# Patient Record
Sex: Female | Born: 1990 | Hispanic: Yes | Marital: Single | Smoking: Never smoker
Health system: Southern US, Community
[De-identification: ages and names within clinical notes are randomized; demographics above are authoritative.]

## PROBLEM LIST (undated history)

## (undated) ENCOUNTER — Inpatient Hospital Stay (HOSPITAL_COMMUNITY): Payer: Self-pay

## (undated) DIAGNOSIS — Z789 Other specified health status: Secondary | ICD-10-CM

## (undated) HISTORY — DX: Other specified health status: Z78.9

---

## 2012-05-04 ENCOUNTER — Emergency Department (HOSPITAL_COMMUNITY)
Admission: EM | Admit: 2012-05-04 | Discharge: 2012-05-04 | Disposition: A | Discharge status: Home / Self Care | Payer: Self-pay | Attending: Emergency Medicine | Admitting: Emergency Medicine

## 2012-05-04 ENCOUNTER — Encounter (HOSPITAL_COMMUNITY): Payer: Self-pay | Admitting: Emergency Medicine

## 2012-05-04 ENCOUNTER — Emergency Department (HOSPITAL_COMMUNITY): Payer: Self-pay

## 2012-05-04 DIAGNOSIS — O2390 Unspecified genitourinary tract infection in pregnancy, unspecified trimester: Secondary | ICD-10-CM | POA: Insufficient documentation

## 2012-05-04 DIAGNOSIS — N390 Urinary tract infection, site not specified: Secondary | ICD-10-CM | POA: Insufficient documentation

## 2012-05-04 DIAGNOSIS — J029 Acute pharyngitis, unspecified: Secondary | ICD-10-CM | POA: Insufficient documentation

## 2012-05-04 DIAGNOSIS — Z3490 Encounter for supervision of normal pregnancy, unspecified, unspecified trimester: Secondary | ICD-10-CM

## 2012-05-04 LAB — CBC WITH DIFFERENTIAL/PLATELET
Eosinophils Absolute: 0.1 10*3/uL (ref 0.0–0.7)
Hemoglobin: 14 g/dL (ref 12.0–15.0)
Lymphs Abs: 1.5 10*3/uL (ref 0.7–4.0)
MCH: 29.3 pg (ref 26.0–34.0)
Monocytes Relative: 8 % (ref 3–12)
Neutro Abs: 5.3 10*3/uL (ref 1.7–7.7)
Neutrophils Relative %: 71 % (ref 43–77)
Platelets: 264 10*3/uL (ref 150–400)
RBC: 4.78 MIL/uL (ref 3.87–5.11)
WBC: 7.4 10*3/uL (ref 4.0–10.5)

## 2012-05-04 LAB — COMPREHENSIVE METABOLIC PANEL
ALT: 33 U/L (ref 0–35)
Albumin: 4.2 g/dL (ref 3.5–5.2)
Alkaline Phosphatase: 74 U/L (ref 39–117)
Chloride: 101 mEq/L (ref 96–112)
GFR calc Af Amer: >90 mL/min (ref >90)
Glucose, Bld: 94 mg/dL (ref 70–99)
Potassium: 3.4 mEq/L — ABNORMAL LOW (ref 3.5–5.1)
Sodium: 134 mEq/L — ABNORMAL LOW (ref 135–145)
Total Bilirubin: 0.4 mg/dL (ref 0.3–1.2)
Total Protein: 7.5 g/dL (ref 6.0–8.3)

## 2012-05-04 LAB — URINALYSIS, MICROSCOPIC ONLY
Bilirubin Urine: NEGATIVE
Hgb urine dipstick: NEGATIVE
Ketones, ur: NEGATIVE mg/dL
Specific Gravity, Urine: 1.015 (ref 1.005–1.030)
pH: 6.5 (ref 5.0–8.0)

## 2012-05-04 LAB — POCT PREGNANCY, URINE: Preg Test, Ur: POSITIVE — AB

## 2012-05-04 MED ORDER — CEPHALEXIN 500 MG PO CAPS: 500.0000 mg | ORAL_CAPSULE | Freq: Two times a day (BID) | ORAL | Status: DC

## 2012-05-04 MED ORDER — SODIUM CHLORIDE 0.9 % IV SOLN
1000.0000 mL | Freq: Once | INTRAVENOUS | Status: AC
  Administered 2012-05-04: 1000 mL via INTRAVENOUS

## 2012-05-04 NOTE — ED Notes (Signed)
Pt presenting to ed with c/o abdominal pain with nausea and vomiting x 2 days ago. Pt states she also has fever and sore throat. Pt states normal bowel movement yesterday. Pt denies any dysuria at this time

## 2012-05-04 NOTE — ED Notes (Signed)
Pt c/o abdominal pain x2days.  She states that it hurts when she eats and that it feels like her stomach is very big and full of air.  Pt states that her last BM was today and that she has passing flatus.  States she did have N/V yesterday.

## 2012-05-04 NOTE — ED Provider Notes (Signed)
Medical screening examination/treatment/procedure(s) were performed by non-physician practitioner and as supervising physician I was immediately available for consultation/collaboration.  Cheri Guppy, MD 05/04/12 [phone]

## 2012-05-04 NOTE — ED Notes (Signed)
RN to obtain labs with start of IV 

## 2012-05-04 NOTE — ED Provider Notes (Signed)
History     CSN: [id number]  Arrival date & time 05/04/12  1311   First MD Initiated Contact with Patient 05/04/12 1353      Chief Complaint  Patient presents with  . Abdominal Pain  . Sore Throat    (Consider location/radiation/quality/duration/timing/severity/associated sxs/prior treatment) Patient is a 21 y.o. female presenting with abdominal pain. The history is provided by the patient.  Abdominal Pain The primary symptoms of the illness include abdominal pain, fatigue, nausea and vomiting. The primary symptoms of the illness do not include fever, shortness of breath, diarrhea, hematemesis, hematochezia, dysuria, vaginal discharge or vaginal bleeding. The current episode started 2 days ago. The onset of the illness was gradual. The problem has not changed since onset. The abdominal pain is generalized. The abdominal pain does not radiate. The severity of the abdominal pain is 5/10. The abdominal pain is relieved by nothing.  The nausea is associated with eating. The nausea is exacerbated by food, motion and activity.   Symptoms associated with the illness do not include chills, heartburn, constipation, urgency, hematuria, frequency or back pain. Significant associated medical issues do not include PUD, GERD, inflammatory bowel disease, diabetes, sickle cell disease, gallstones, liver disease, substance abuse, diverticulitis, HIV or cardiac disease.    History reviewed. No pertinent past medical history.  Past Surgical History  Procedure Date  . Cesarean section     No family history on file.  History  Substance Use Topics  . Smoking status: Never Smoker   . Smokeless tobacco: Not on file  . Alcohol Use: No    OB History    Grav Para Term Preterm Abortions TAB SAB Ect Mult Living                  Review of Systems  Constitutional: Positive for fatigue. Negative for fever, chills and appetite change.  HENT: Negative for congestion.   Eyes: Negative for visual  disturbance.  Respiratory: Negative for shortness of breath.   Cardiovascular: Negative for chest pain and leg swelling.  Gastrointestinal: Positive for nausea, vomiting and abdominal pain. Negative for heartburn, diarrhea, constipation, hematochezia and hematemesis.  Genitourinary: Negative for dysuria, urgency, frequency, hematuria, vaginal bleeding and vaginal discharge.  Musculoskeletal: Negative for back pain.  Neurological: Negative for dizziness, syncope, weakness, light-headedness, numbness and headaches.  Psychiatric/Behavioral: Negative for confusion.  All other systems reviewed and are negative.    Allergies  Review of patient's allergies indicates no known allergies.  Home Medications   Current Outpatient Rx  Name Route Sig Dispense Refill  . ACETAMINOPHEN 160 MG/5ML PO SOLN Oral Take 320 mg by mouth every 4 (four) hours as needed. For pain/fever.      BP 107/60  Pulse 95  Temp 98.2 F (36.8 C) (Oral)  Resp 20  SpO2 100%  LMP 04/07/2012  Physical Exam  Constitutional: She is oriented to person, place, and time. She appears well-developed and well-nourished. No distress.  HENT:  Head: Normocephalic and atraumatic.  Mouth/Throat: Oropharynx is clear and moist. No oropharyngeal exudate.  Eyes: Conjunctivae normal and EOM are normal. Pupils are equal, round, and reactive to light. No scleral icterus.  Neck: Normal range of motion. Neck supple. No tracheal deviation present. No thyromegaly present.  Cardiovascular: Normal rate, regular rhythm, normal heart sounds and intact distal pulses.   Pulmonary/Chest: Effort normal and breath sounds normal. No stridor. No respiratory distress. She has no wheezes.  Abdominal: Soft. There is generalized tenderness.    Musculoskeletal: Normal range of motion.  She exhibits no edema and no tenderness.  Neurological: She is alert and oriented to person, place, and time. Coordination normal.  Skin: Skin is warm and dry. No rash  noted. She is not diaphoretic. No erythema. No pallor.  Psychiatric: She has a normal mood and affect. Her behavior is normal.    ED Course  Procedures (including critical care time)  Labs Reviewed  COMPREHENSIVE METABOLIC PANEL - Abnormal; Notable for the following:    Sodium 134 (*)     Potassium 3.4 (*)     All other components within normal limits  URINALYSIS, MICROSCOPIC ONLY - Abnormal; Notable for the following:    APPearance CLOUDY (*)     Leukocytes, UA MODERATE (*)     Bacteria, UA MANY (*)     Squamous Epithelial / LPF MANY (*)     All other components within normal limits  POCT PREGNANCY, URINE - Abnormal; Notable for the following:    Preg Test, Ur POSITIVE (*)     All other components within normal limits  CBC WITH DIFFERENTIAL  LIPASE, BLOOD  RAPID STREP SCREEN   US Abdomen Complete  05/04/2012  *RADIOLOGY REPORT*  Clinical Data:  Abdominal pain.  Pregnant.  COMPLETE ABDOMINAL ULTRASOUND  Comparison:  None.  Findings:  Gallbladder:  No gallstones, gallbladder wall thickening, or pericholecystic fluid.  Common bile duct:  Normal in caliber, measuring 2.9 mm in diameter proximally.  Liver:  Diffusely echogenic.  IVC:  Appears normal.  Pancreas:  Obscured by overlying bowel gas.  The expected position of the pancreasis is in the region of pain reported by the patient.  Spleen:  Normal, measuring 8.1 cm in length.  Right Kidney:  Normal, measuring 11.5 cm in length.  Left Kidney:  Normal, measuring 10.8 cm in length.  Abdominal aorta:  Obscured by overlying bowel gas distally.  The visualized portions appear normal.  IMPRESSION:  1.  No gallstones seen. 2.  Obscured pancreas and distal abdominal aorta by overlying bowel gas.  The patient's pain is in the region of the expected location of the pancreas. 3.  Diffusely echogenic liver, most likely due to steatosis.   Original Report Authenticated By: Darrol Angel, M.D.      No diagnosis found.    MDM  Pregnant UTI- dc  keflex & culture pending   21 year old female to ER with chief complaint of abdominal pain, nausea and emesis  with onset of symptoms 2 days ago.  Labs and imaging reviewed and patient is found to have a possible urinary tract infection they'll be treated with Keflex and an incidental positive pregnancy test.  At this time there does not appear to be any evidence of an acute emergency medical condition and the patient appears stable for discharge with appropriate outpatient follow up ([facility]) .Diagnosis was discussed with patient who verbalizes understanding and is agreeable to discharge.        Jaci Carrel, [place] 05/04/12 1629

## 2012-05-06 LAB — URINE CULTURE

## 2012-05-16 ENCOUNTER — Ambulatory Visit (INDEPENDENT_AMBULATORY_CARE_PROVIDER_SITE_OTHER): Payer: Self-pay | Admitting: Advanced Practice Midwife

## 2012-05-16 ENCOUNTER — Encounter: Payer: Self-pay | Admitting: Family Medicine

## 2012-05-16 ENCOUNTER — Encounter: Payer: Self-pay | Admitting: Advanced Practice Midwife

## 2012-05-16 VITALS — BP 115/67 | Temp 97.2°F | Wt 136.8 lb

## 2012-05-16 DIAGNOSIS — Z23 Encounter for immunization: Secondary | ICD-10-CM

## 2012-05-16 DIAGNOSIS — N911 Secondary amenorrhea: Secondary | ICD-10-CM | POA: Insufficient documentation

## 2012-05-16 DIAGNOSIS — Z3490 Encounter for supervision of normal pregnancy, unspecified, unspecified trimester: Secondary | ICD-10-CM

## 2012-05-16 DIAGNOSIS — O34219 Maternal care for unspecified type scar from previous cesarean delivery: Secondary | ICD-10-CM

## 2012-05-16 DIAGNOSIS — N926 Irregular menstruation, unspecified: Secondary | ICD-10-CM | POA: Insufficient documentation

## 2012-05-16 DIAGNOSIS — N912 Amenorrhea, unspecified: Secondary | ICD-10-CM

## 2012-05-16 LAB — HIV ANTIBODY (ROUTINE TESTING W REFLEX): HIV: NONREACTIVE

## 2012-05-16 MED ORDER — INFLUENZA VIRUS VACC SPLIT PF IM SUSP: 0.5000 mL | INTRAMUSCULAR | Status: DC

## 2012-05-16 NOTE — Patient Instructions (Signed)
Pregnancy - First Trimester During sexual intercourse, millions of sperm go into the vagina. Only 1 sperm will penetrate and fertilize the female egg while it is in the Fallopian tube. One week later, the fertilized egg implants into the wall of the uterus. An embryo begins to develop into a baby. At 6 to 8 weeks, the eyes and face are formed and the heartbeat can be seen on ultrasound. At the end of 12 weeks (first trimester), all the baby's organs are formed. Now that you are pregnant, you will want to do everything you can to have a healthy baby. Two of the most important things are to get good prenatal care and follow your caregiver's instructions. Prenatal care is all the medical care you receive before the baby's birth. It is given to prevent, find, and treat problems during the pregnancy and childbirth. PRENATAL EXAMS  During prenatal visits, your weight, blood pressure and urine are checked. This is done to make sure you are healthy and progressing normally during the pregnancy.  A pregnant woman should gain 25 to 35 pounds during the pregnancy. However, if you are over weight or underweight, your caregiver will advise you regarding your weight.  Your caregiver will ask and answer questions for you.  Blood work, cervical cultures, other necessary tests and a Pap test are done during your prenatal exams. These tests are done to check on your health and the probable health of your baby. Tests are strongly recommended and done for HIV with your permission. This is the virus that causes AIDS. These tests are done because medications can be given to help prevent your baby from being born with this infection should you have been infected without knowing it. Blood work is also used to find out your blood type, previous infections and follow your blood levels (hemoglobin).  Low hemoglobin (anemia) is common during pregnancy. Iron and vitamins are given to help prevent this. Later in the pregnancy, blood  tests for diabetes will be done along with any other tests if any problems develop. You may need tests to make sure you and the baby are doing well.  You may need other tests to make sure you and the baby are doing well. CHANGES DURING THE FIRST TRIMESTER (THE FIRST 3 MONTHS OF PREGNANCY) Your body goes through many changes during pregnancy. They vary from person to person. Talk to your caregiver about changes you notice and are concerned about. Changes can include:  Your menstrual period stops.  The egg and sperm carry the genes that determine what you look like. Genes from you and your partner are forming a baby. The female genes determine whether the baby is a boy or a girl.  Your body increases in girth and you may feel bloated.  Feeling sick to your stomach (nauseous) and throwing up (vomiting). If the vomiting is uncontrollable, call your caregiver.  Your breasts will begin to enlarge and become tender.  Your nipples may stick out more and become darker.  The need to urinate more. Painful urination may mean you have a bladder infection.  Tiring easily.  Loss of appetite.  Cravings for certain kinds of food.  At first, you may gain or lose a couple of pounds.  You may have changes in your emotions from day to day (excited to be pregnant or concerned something may go wrong with the pregnancy and baby).  You may have more vivid and strange dreams. HOME CARE INSTRUCTIONS   It is very important   to avoid all smoking, alcohol and un-prescribed drugs during your pregnancy. These affect the formation and growth of the baby. Avoid chemicals while pregnant to ensure the delivery of a healthy infant.  Start your prenatal visits by the 12th week of pregnancy. They are usually scheduled monthly at first, then more often in the last 2 months before delivery. Keep your caregiver's appointments. Follow your caregiver's instructions regarding medication use, blood and lab tests, exercise, and  diet.  During pregnancy, you are providing food for you and your baby. Eat regular, well-balanced meals. Choose foods such as meat, fish, milk and other low fat dairy products, vegetables, fruits, and whole-grain breads and cereals. Your caregiver will tell you of the ideal weight gain.  You can help morning sickness by keeping soda crackers at the bedside. Eat a couple before arising in the morning. You may want to use the crackers without salt on them.  Eating 4 to 5 small meals rather than 3 large meals a day also may help the nausea and vomiting.  Drinking liquids between meals instead of during meals also seems to help nausea and vomiting.  A physical sexual relationship may be continued throughout pregnancy if there are no other problems. Problems may be early (premature) leaking of amniotic fluid from the membranes, vaginal bleeding, or belly (abdominal) pain.  Exercise regularly if there are no restrictions. Check with your caregiver or physical therapist if you are unsure of the safety of some of your exercises. Greater weight gain will occur in the last 2 trimesters of pregnancy. Exercising will help:  Control your weight.  Keep you in shape.  Prepare you for labor and delivery.  Help you lose your pregnancy weight after you deliver your baby.  Wear a good support or jogging bra for breast tenderness during pregnancy. This may help if worn during sleep too.  Ask when prenatal classes are available. Begin classes when they are offered.  Do not use hot tubs, steam rooms or saunas.  Wear your seat belt when driving. This protects you and your baby if you are in an accident.  Avoid raw meat, uncooked cheese, cat litter boxes and soil used by cats throughout the pregnancy. These carry germs that can cause birth defects in the baby.  The first trimester is a good time to visit your dentist for your dental health. Getting your teeth cleaned is OK. Use a softer toothbrush and brush  gently during pregnancy.  Ask for help if you have financial, counseling or nutritional needs during pregnancy. Your caregiver will be able to offer counseling for these needs as well as refer you for other special needs.  Do not take any medications or herbs unless told by your caregiver.  Inform your caregiver if there is any mental or physical domestic violence.  Make a list of emergency phone numbers of family, friends, hospital, and police and fire departments.  Write down your questions. Take them to your prenatal visit.  Do not douche.  Do not cross your legs.  If you have to stand for long periods of time, rotate you feet or take small steps in a circle.  You may have more vaginal secretions that may require a sanitary pad. Do not use tampons or scented sanitary pads. MEDICATIONS AND DRUG USE IN PREGNANCY  Take prenatal vitamins as directed. The vitamin should contain 1 milligram of folic acid. Keep all vitamins out of reach of children. Only a couple vitamins or tablets containing iron may be   fatal to a baby or young child when ingested.  Avoid use of all medications, including herbs, over-the-counter medications, not prescribed or suggested by your caregiver. Only take over-the-counter or prescription medicines for pain, discomfort, or fever as directed by your caregiver. Do not use aspirin, ibuprofen, or naproxen unless directed by your caregiver.  Let your caregiver also know about herbs you may be using.  Alcohol is related to a number of birth defects. This includes fetal alcohol syndrome. All alcohol, in any form, should be avoided completely. Smoking will cause low birth rate and premature babies.  Street or illegal drugs are very harmful to the baby. They are absolutely forbidden. A baby born to an addicted mother will be addicted at birth. The baby will go through the same withdrawal an adult does.  Let your caregiver know about any medications that you have to take  and for what reason you take them. MISCARRIAGE IS COMMON DURING PREGNANCY A miscarriage does not mean you did something wrong. It is not a reason to worry about getting pregnant again. Your caregiver will help you with questions you may have. If you have a miscarriage, you may need minor surgery. SEEK MEDICAL CARE IF:  You have any concerns or worries during your pregnancy. It is better to call with your questions if you feel they cannot wait, rather than worry about them. SEEK IMMEDIATE MEDICAL CARE IF:   An unexplained oral temperature above 102 F (38.9 C) develops, or as your caregiver suggests.  You have leaking of fluid from the vagina (birth canal). If leaking membranes are suspected, take your temperature and inform your caregiver of this when you call.  There is vaginal spotting or bleeding. Notify your caregiver of the amount and how many pads are used.  You develop a bad smelling vaginal discharge with a change in the color.  You continue to feel sick to your stomach (nauseated) and have no relief from remedies suggested. You vomit blood or coffee ground-like materials.  You lose more than 2 pounds of weight in 1 week.  You gain more than 2 pounds of weight in 1 week and you notice swelling of your face, hands, feet, or legs.  You gain 5 pounds or more in 1 week (even if you do not have swelling of your hands, face, legs, or feet).  You get exposed to German measles and have never had them.  You are exposed to fifth disease or chickenpox.  You develop belly (abdominal) pain. Round ligament discomfort is a common non-cancerous (benign) cause of abdominal pain in pregnancy. Your caregiver still must evaluate this.  You develop headache, fever, diarrhea, pain with urination, or shortness of breath.  You fall or are in a car accident or have any kind of trauma.  There is mental or physical violence in your home. Document Released: 07/26/2001 Document Revised: 10/24/2011  Document Reviewed: 01/27/2009 [employer] Patient Information 2013 [employer].  

## 2012-05-16 NOTE — Progress Notes (Signed)
   Subjective:    Alison Zuniga is a G2P1001 Unknown being seen today for her first obstetrical visit.  Her obstetrical history is significant for Unsure Dates, Irregular cycles. Patient does intend to breast feed. Pregnancy history fully reviewed.  Patient reports no bleeding, no contractions, no cramping and no leaking.  Filed Vitals:   05/16/12 0843  BP: 115/67  Temp: 97.2 F (36.2 C)  Weight: 136 lb 12.8 oz (62.052 kg)    HISTORY: OB History    Grav Para Term Preterm Abortions TAB SAB Ect Mult Living   2 1 1  0 0  0   1     # Outc Date GA Lbr Len/2nd Wgt Sex Del Anes PTL Lv   1 TRM 10/11 [redacted]w[redacted]d  8lb10oz(3.912kg) M CS EPI No Yes   Comments: Tried vaginal birth but baby was too big and heavy.   2 CUR              Past Medical History  Diagnosis Date  . No pertinent past medical history    Past Surgical History  Procedure Date  . Cesarean section    History reviewed. No pertinent family history.   Exam    Uterus:     Pelvic Exam:    Perineum: Normal Perineum   Vulva: normal   Vagina:  normal mucosa, normal discharge   pH:    Cervix: no cervical motion tenderness and no lesions   Adnexa: normal adnexa   Bony Pelvis: gynecoid  System: Breast:  normal appearance, no masses or tenderness   Skin: normal coloration and turgor, no rashes    Neurologic: oriented, normal mood, grossly non-focal   Extremities: normal strength, tone, and muscle mass   HEENT    Mouth/Teeth    Neck supple   Cardiovascular: regular rate and rhythm   Respiratory:  appears well, vitals normal, no respiratory distress, acyanotic, normal RR,    Abdomen: soft, non-tender; bowel sounds normal; no masses,  no organomegaly   Urinary: urethral meatus normal   Uterus very small, 4 week size.   Assessment:    Pregnancy: G2P1001 Patient Active Problem List  Diagnosis  . Irregular menses  . Amenorrhea, secondary       Very early pregnancy, unsure of viabilty.  Plan:     Initial  labs drawn. Prenatal vitamins. Problem list reviewed and updated. Genetic Screening not discussed today due to very early nature of pregnancy and unsure viability  Ultrasound discussed;: not ordered. Early OB US ordered with Quant HCG.  Follow up in 4 weeks.  WILL REPEAT QUANT IN 2 DAYS 50% of 30 min visit spent on counseling and coordination of care.     [facility] 05/16/2012

## 2012-05-16 NOTE — Progress Notes (Signed)
Pulse 82 Pt is unsure of her lmp. Has a lot of pressure in her vaginal area. Every time she eats she feels very bloated and has stomach pain.

## 2012-05-17 ENCOUNTER — Ambulatory Visit (HOSPITAL_COMMUNITY)
Admission: RE | Admit: 2012-05-17 | Discharge: 2012-05-17 | Disposition: A | Discharge status: Home / Self Care | Payer: Self-pay | Source: Ambulatory Visit | Attending: Advanced Practice Midwife | Admitting: Advanced Practice Midwife

## 2012-05-17 ENCOUNTER — Other Ambulatory Visit: Payer: Self-pay | Admitting: Advanced Practice Midwife

## 2012-05-17 DIAGNOSIS — Z3490 Encounter for supervision of normal pregnancy, unspecified, unspecified trimester: Secondary | ICD-10-CM

## 2012-05-17 DIAGNOSIS — Z3689 Encounter for other specified antenatal screening: Secondary | ICD-10-CM | POA: Insufficient documentation

## 2012-05-17 LAB — OBSTETRIC PANEL
Antibody Screen: NEGATIVE
Basophils Absolute: 0.1 10*3/uL (ref 0.0–0.1)
HCT: 42 % (ref 36.0–46.0)
Hemoglobin: 14.5 g/dL (ref 12.0–15.0)
Lymphocytes Relative: 21 % (ref 12–46)
Lymphs Abs: 1.7 10*3/uL (ref 0.7–4.0)
Monocytes Absolute: 0.5 10*3/uL (ref 0.1–1.0)
Monocytes Relative: 6 % (ref 3–12)
Neutro Abs: 5.7 10*3/uL (ref 1.7–7.7)
RBC: 4.99 MIL/uL (ref 3.87–5.11)
Rubella: 63.7 IU/mL — ABNORMAL HIGH
WBC: 8.1 10*3/uL (ref 4.0–10.5)

## 2012-05-18 ENCOUNTER — Other Ambulatory Visit: Payer: Self-pay

## 2012-05-18 DIAGNOSIS — N911 Secondary amenorrhea: Secondary | ICD-10-CM

## 2012-05-18 LAB — HEMOGLOBINOPATHY EVALUATION: Hgb S Quant: 0 %

## 2012-05-18 LAB — CULTURE, OB URINE

## 2012-05-19 LAB — HCG, QUANTITATIVE, PREGNANCY: hCG, Beta Chain, Quant, S: 99625.4 m[IU]/mL

## 2012-06-13 ENCOUNTER — Encounter: Payer: Self-pay | Admitting: Obstetrics and Gynecology

## 2012-06-13 ENCOUNTER — Ambulatory Visit (INDEPENDENT_AMBULATORY_CARE_PROVIDER_SITE_OTHER): Payer: Self-pay | Admitting: Family Medicine

## 2012-06-13 ENCOUNTER — Other Ambulatory Visit: Payer: Self-pay | Admitting: Family Medicine

## 2012-06-13 VITALS — BP 98/69 | Temp 98.2°F | Wt 135.1 lb

## 2012-06-13 DIAGNOSIS — Z3490 Encounter for supervision of normal pregnancy, unspecified, unspecified trimester: Secondary | ICD-10-CM

## 2012-06-13 DIAGNOSIS — Z3480 Encounter for supervision of other normal pregnancy, unspecified trimester: Secondary | ICD-10-CM

## 2012-06-13 DIAGNOSIS — Z3682 Encounter for antenatal screening for nuchal translucency: Secondary | ICD-10-CM

## 2012-06-13 LAB — POCT URINALYSIS DIP (DEVICE)
Ketones, ur: NEGATIVE mg/dL
Protein, ur: NEGATIVE mg/dL
Urobilinogen, UA: 1 mg/dL (ref 0.0–1.0)

## 2012-06-13 MED ORDER — RANITIDINE HCL 150 MG PO TABS: 150.0000 mg | ORAL_TABLET | Freq: Two times a day (BID) | ORAL | Status: DC | PRN

## 2012-06-13 NOTE — Progress Notes (Signed)
Pulse: 88

## 2012-06-13 NOTE — Patient Instructions (Signed)
Embarazo  Primer trimestre  (Pregnancy - First Trimester)  Durante el acto sexual, millones de espermatozoides entran en la vagina. Slo 1 espermatozoide penetra y fertiliza al vulo mientras se encuentra en la trompa de [place]. Una semana ms tarde, el vulo fertilizado se implanta en la pared del tero. Un embrin comienza a desarrollarse para ser un beb. A las 6 a 8 semanas se forman los ojos y la cara y los latidos del corazn se pueden ver en la ecografa. Al final de las 12 semanas (primer trimestre) todos los rganos del beb estn formados. Ahora que est embarazada, querr hacer todo lo que est a su alcance para tener un beb sano. Dos de las cosas ms importantes son: tener una buena atencin prenatal y seguir las indicaciones del profesional que la asiste. La atencin prenatal incluye toda la asistencia mdica que usted recibe antes del nacimiento del beb. Se lleva a cabo para prevenir y tratar problemas durante el embarazo y el parto. EXAMENES PRENATALES   Durante las visitas prenatales se controlan el peso, la presin arterial y se solicitan anlisis de orina. Esto se hace para asegurarse de que usted est sana y el embarazo progrese normalmente.  Una mujer embarazada debe aumentar de 25 a 35 libras durante el embarazo. Sin embargo, si usted tiene sobrepeso o bajo peso, su mdico le aconsejar qu hacer.  El podr hacerle preguntas y responder todas las que usted le haga.  Durante los exmenes prenatales se solicitan anlisis de sangre, cultivos del tero, un [place] y otros anlisis necesarios. Estas pruebas se realizan para controlar su salud y la del beb. Se recomienda que se haga la prueba para el diagnstico del VIH, con su autorizacin. Este es el virus que causa el SIDA. Estas pruebas se realizan porque existen medicamentos que podran administrarle para prevenir que el beb nazca con esta infeccin, si usted estuviera infectada y no lo supiera. Los anlisis de sangre tambin  se realizan para determinar el tipo de sangre, si tuvo infecciones previas y controlar sus niveles en la sangre (hemoglobina).  Tener un recuento bajo de hemoglobina (anemia) es comn durante el embarazo. Para prevenirla, se administran hierro y vitaminas. En una etapa ms avanzada del embarazo, le indicarn exmenes de sangre para saber si tiene diabetes, junto con otros anlisis, en caso de que tuviera problemas. Es necesario que se haga las pruebas para asegurarse de que usted y el beb estn bien.  Es posible que necesite otras pruebas adicionales. CAMBIOS DURANTE EL PRIMER TRIMESTRE (LOS TRES PRIMEROS MESES DEL EMBARAZO).  Su organismo atravesar numerosos cambios durante el embarazo. Estos pueden variar de una persona a otra. Converse con el mdico acerca los cambios que usted nota y que la preocupan. Ellos son:   El perodo menstrual se detiene.  El vulo y los espermatozoides llevan los genes que determinan cmo seremos. Sus genes y los de su pareja forman el beb. Los genes del varn determinan si ser un nio o una nia.  La circunferencia de la cintura va a ir aumentando y podr sentirse hinchada.  Puede tener malestar estomacal (nuseas) y vmitos. Si no puede controlar los vmitos, consulte a su mdico.  Sus mamas comenzarn a agrandarse y sensibilizarse.  Los pezones pueden sobresalir ms y ser ms oscuros.  Tendr necesidad de orinar ms. El dolor al orinar puede significar que usted tiene una infeccin de la vejiga.  Se cansar con facilidad.  Prdida del apetito.  Sentir un fuerte deseo de consumir ciertos alimentos.    Al principio, usted puede ganar o perder un par de kilos.  Podr tener cambios emocionales de un da a otro (entusiasmo por estar embarazada o preocupacin por Firefighter y el beb).  Tendr sueos ms vvidos y extraos. INSTRUCCIONES PARA EL CUIDADO EN EL HOGAR   Es muy importante evitar el cigarrillo, el alcohol y los frmacos no recetados  Academic librarian. Estas sustancias afectan la formacin y el desarrollo del beb. Evite los productos qumicos durante el embarazo para Games developer salud del beb.  Comience las consultas prenatales alrededor de la 12 semana de [place]. Generalmente se programan cada mes al principio y se hacen ms frecuentes en los 2 ltimos meses antes del parto. Cumpla con las citas de control. Siga las indicaciones del mdico con respecto al uso de [employer]onalds, los anlisis y pruebas de [place], los ejercicios y Psychologist, forensic.  Durante el embarazo debe obtener nutrientes para usted y para su beb. Consuma alimentos balanceados. Elija alimentos como carne, pescado, [place] y otros productos lcteos descremados, vegetales, frutas, panes integrales y cereales. El [employer]ot informar cul es el aumento de peso ideal.  Las nuseas matinales pueden aliviarse si come algunas galletitas saladas en la cama. Coma dos galletitas antes de levantarse por la maana. Tambin puede comer galletitas sin sal.  Hacer 4 o 5 comidas pequeas en lugar de 3 comidas grandes por da tambin puede [employer] nuseas y los vmitos.  Beber lquidos [place] comidas en lugar de tomarlos durante las comidas tambin puede ayudar a Optician, dispensing las nuseas y los vmitos.  Puede continuar teniendo [employer] durante todo el embarazo si no hay otros problemas. Los problemas pueden ser una prdida precoz (prematura) de lquido amnitico, sangrado vaginal, o dolor en el vientre (abdominal).  Realice [employer], si no tiene restricciones. Consulte con su mdico o terapeuta fsico si no est segura de algunos de sus ejercicios. El mayor aumento de peso se producir en los ltimos 2 trimestres del Psychiatrist. El ejercicio le ayudar a:  Engineering geologist.  Mantenerse en forma.  Prepararse para el parto.  La ayudar a perder el peso del embarazo despus de que nazca su beb.  Use un buen sostn o como los que se usan  para hacer deportes para Paramedic la sensibilidad de las [place]. Tambin puede serle til si lo [place] mientras duerme.  Consulte cuando puede comenzar con las clases de pre parto. Comience con las clases cuando estn disponibles.  No utilice la baera con agua caliente, baos turcos y saunas.  Colquese el cinturn de seguridad cuando conduzca. Este la proteger a usted y al beb en caso de accidente.  Evite comer carne cruda y el contacto con los utensilios y desperdicios de los gatos. Estos elementos contienen grmenes que pueden causar defectos de nacimiento en el beb.  El primer trimestre es un buen momento para visitar a su dentista y Software engineer. Es importante mantener los dientes limpios. Use un cepillo de dientes suave y cepllese con ms suavidad durante el [employer].  Pida ayuda si tienen necesidades financieras, teraputicas o nutricionales. El profesional podr ayudarla con respecto a estas necesidades, o derivarla a otros especialistas.  No tome medicamentos o hierbas excepto aquellos que Fish farm manager.  Informe a su mdico si sufre violencia familiar mental o fsica.  Haga una lista de nmeros de telfono de Associate Professor de la familia, los amigos, el hospital y los departamentos de polica y bomberos.  Escriba sus  preguntas. Llvelas cuando concurra a su visita prenatal.  No se haga duchas vaginales.  No cruce las piernas.  Si usted tiene que estar parada por largos perodos de [place], gire los pies o de pequeos pasos en crculo.  Es posible que tenga ms secreciones vaginales que puedan requerir una toalla higinica. No use tampones o toallas higinicas perfumadas. EL CONSUMO DE MEDICAMENTOS Y FRMACOS DURANTE EL EMBARAZO   Tome las vitaminas para la etapa prenatal tal como se le indic. Las vitaminas deben contener un miligramo de cido flico. Guarde todas las vitaminas fuera del alcance de los nios. La ingestin de slo un par de vitaminas o  tabletas que contengan hierro pueden ocasionar la [employer] en un beb o en un nio pequeo.  Evite el uso de [employer], incluyendo hierbas, medicamentos de [place], sin receta o que no hayan sido sugeridos por su mdico. Slo tome medicamentos de venta libre o medicamentos recetados para Chief Technology Officer, Environmental health practitioner o fiebre como lo indique su mdico. No tome aspirina, ibuprofeno o naproxeno excepto que su mdico se lo indique.  Infrmele al profesional si consume medicamentos de hierbas.  El alcohol se relaciona con ciertos defectos congnitos. Incluye el sndrome de alcoholismo fetal. Debe evitar absolutamente el consumo de alcohol, en cualquier forma. El fumar causar baja tasa de natalidad y bebs prematuros.  Las drogas ilegales o de la calle son muy perjudiciales para el beb. Estn absolutamente prohibidas. Un beb que nace de [employer], ser adicto al nacer. Ese beb tendr los mismos sntomas de abstinencia que un adulto.  Informe a su mdico acerca de los medicamentos que ha tomado y el motivo por el que los tom. EL ABORTO ESPONTNEO ES COMN DURANTE EL EMBARAZO  Esto no significa que haya hecho algo mal. No es un motivo para preocuparse en caso de un nuevo embarazo. El profesional que la asiste la ayudar si tiene preguntas para formular. Si tiene un aborto espontneo podr requerir Futures trader.  SOLICITE ATENCIN MDICA SI:  Tiene preguntas o preocupaciones relacionadas con el embarazo. Es mejor que llame para formular las preguntas si no puede esperar hasta la prxima visita, que sentirse preocupada por ellas.  SOLICITE ATENCIN MDICA DE INMEDIATO SI:   La temperatura oral le sube a ms de 102 F (38.9 C) o lo que su mdico le indique.  Tiene una prdida de lquido por la vagina (canal de parto). Si sospecha una ruptura de las [place], tmese la temperatura y llame al profesional para informarlo sobre esto.  Observa unas pequeas manchas o una hemorragia  vaginal. Notifique al profesional acerca de la cantidad y de cuntos apsitos est utilizando.  Presenta un olor desagradable en la secrecin vaginal y observa un cambio en el color.  Contina con las nuseas y no obtiene alivio de los remedios indicados. Vomita sangre o algo similar a la borra del caf.  Pierde ms de 2 libras (1 Kg) en una semana.  Aumenta ms de 1 Kg en una semana y [address], las manos, los pies o las piernas hinchados.  Aumenta ms de 2,5 Kg en una semana (aunque no tenga las manos, pies, piernas o el rostro hinchados).  Ha estado expuesta a la rubola y no ha sufrido la enfermedad.  Ha estado expuesta a la quinta enfermedad o a la varicela.  Siente dolor en el vientre (abdominal). Las [employer] en el ligamento redondo son Neomia Dear causa benigna frecuente de dolor abdominal durante el embarazo. El  profesional que la asiste deber evaluarla.  Presenta dolor de Renne Musca, diarrea, dolor al orinar o le falta la respiracin.  Se cae, se ve involucrada en un accidente automovilstico o sufre algn tipo de traumatismo.  En su hogar hay violencia mental o fsica. Document Released: 05/11/2005 Document Revised: 01/31/2012 [facility] Patient Information 2013 [place], [place].  Nuseas matinales (Morning Sickness)  Se denominan nuseas matinales a las ganas de vomitar (nuseas) durante el Psychiatrist. Esta sensacin puede estar acompaada o no de vmitos. Aparecen por la maana, pero puede ser un problema a lo largo de [employer]. Aunque son molestas, generalmente no causan ningn dao, excepto que presente vmitos continuos e intensos (hiperemesis gravdica). Este problema requiere un tratamiento ms intenso. CAUSAS La causa no se conoce, pero parece estar relacionado con un incremento sbito de dos hormonas:   La gonadotrofina corinica humana.   Los estrgenos.  Ambas hormonas elevan su nivel en la primera etapa del embarazo. TRATAMIENTO No utilice ningn  medicamento (prescripto, de venta libre ni hierbas) para este problema sin consultar con su mdico. Algunas pacientes obtienen ayuda haciendo lo siguiente:  Vitamin B6, 25mg  cada 8 horas o Vitamin B6 inyectable.   Un antihistamnico, doxylamina, 10 mg. cada 8 horas.   El jengibre, planta medicinal, parece ser de utilidad en [employer].  INSTRUCCIONES PARA EL CUIDADO DOMICILIARIO  Tomar un multivitamnico antes de quedar embarazada puede prevenir o disminuir la gravedad de las nuseas matinales en la mayoria de las [place].   Coma un trozo de [place] seca o cracker sin sal antes de levantarse de la cama por la maana.   Coma 5  6 comidas pequeas por da.   Consuma alimentos blandos y secos (arroz, papas asadas).   No beba lquidos con las comidas, hgalo entre comidas.   Evite los alimentos muy grasos o condimentados.   Pdale a otra persona que cocine para usted si [employer] de algn alimento le provoca nuseas o vmitos.   Evite los comprimidos vitamnicos con hierro, debido a que provoca nuseas.   Tome colaciones de alimentos proteicos entre comidas si siente apetito.   Coma gelatina sin azcar de postre.   Una pulsera de acupresin ( que se utiliza para Research scientist (life sciences) en viajes) puede ser de [employer].   La acupuntura puede ayudarla.   No fume.   Consiga un humidificador para Customer service manager de su casa libre de [employer].  SOLICITE ATENCIN MDICA SI:  Los remedios caseros no funcionan y Media planner.   Se siente mareada o sufre un desmayo.   Pierde peso.   Necesita ayuda con su dieta.  SOLICITE ATENCIN MDICA DE INMEDIATO SI:  Tiene nuseas y vmitos de manera persistente y no puede controlarlos.   Se desmaya.   Tiene fiebre.  ASEGURESE QUE:   Comprende estas instrucciones.   Controlar su enfermedad.   Solicitar ayuda de inmediato si no mejora o si empeora.  Document Released: 11/17/2008 Document Revised: 07/21/2011 [facility] Patient Information  2012 [place], [place].  Acidez de estmago durante el embarazo  (Heartburn During Pregnancy)  La acidez es la sensacin de ardor en el pecho que se siente cuando el cido del estmago vuelve haca el esfago. La acidez (tambin llamada "reflujo") es frecuente en el embarazo debido a ciertos cambios hormonales (progesterona). La progesterona relaja la vlvula que separa el esfago del [place]. Esto hace que el cido suba al esfago y cause acidez. Tambin puede ocurrir Visual merchandiser debido a que el tero al agrandarse empuja  el estmago, lo que hace que suba ms cido al [employer]. Esto se produce especialmente en las ltimas etapas del embarazo. La acidez generalmente desaparece despus del parto.  CAUSAS  La hormona progesterona.   Cambios en los niveles hormonales.   El tero crece y [address] cido del estmago [place].   Comidas abundantes.   Ciertos alimentos y bebidas.   La prctica de ejercicios.   Aumento en la produccin de cido.  SNTOMAS  Dolor intenso en el pecho o la parte baja de la garganta.   Gusto amargo en la boca.   Tos.  DIAGNSTICO El mdico la diagnostica con una historia clnica cuidadosa en la que pregunta por sus molestias. Le indicar un anlisis de sangre para Clinical research associate cierto tipo de bacteria que se asocia con la [place]. En algunos casos se diagnostica recetando un medicamento para calmar la acidez y viendo si los sntomas mejoran. Durante el embarazo no es frecuente que se indique una endoscopa. En este procedimiento se [place] un tubo con Neomia Dear luz y una cmara en un extremo, y se examina el esfago y Investment banker, corporate.  TRATAMIENTO  El mdico aconsejar sobre el uso de medicamentos de venta libre (anticidos, medicamentos para disminuir la Engineering geologist) en los casos de sntomas leves.   El mdico indicar medicamentos para disminuir el cido estomacal o para proteger la superficie del [place].   El profesional indicar cambios en la dieta.   En casos graves, el  mdico recomendar que eleve la cabecera de la cama con bloques. (Dormir con ms almohadas no es Manufacturing systems engineer, ya que slo modifica la posicin de la cabeza y no mejora el problema principal del reflujo cido del estmago al esfago.)  INSTRUCCIONES PARA EL CUIDADO DOMICILIARIO  Tome todos los medicamentos segn le indic su mdico.   Levante la cabecera de la cama con bloques bajo las patas.   No haga ejercicios enseguida despus de comer.   Evite comer 2  3 horas antes de ir a dormir.  No se acueste enseguida despus de comer.   Haga comidas pequeas durante Glass blower/designer de 3 comidas abundantes.   Identifique los alimentos o las bebidas que empeoran sus sntomas y evtelos. Los alimentos que debe evitar son:   Pimientos.   Chocolate.   Alimentos alto en grasas, incluidos los fritos.   Comidas muy condimentadas.   Ajo, cebolla.   Frutos ctricos, que incluyen naranjas, uvas, limones y limas.   Alimentos que contengan tomates o productos derivados del [place].   Menta.   Bebidas gaseosas y con cafena.   Vinagre.  SOLICITE ATENCIN MDICA DE INMEDIATO SI:  Tiene dolor intenso en el pecho que baja hacia el brazo o hacia la mandbula o cuello.   Se siente sudoroso, mareado o sufre un desmayo.   Falta de aire.   Vomita sangre.   Dolor o dificultad para tragar.   Materia fecal con sangre o de color negro.   Tiene acidez ms de 3 veces por semana durante ms de 2 semanas.  ASEGRESE DE QUE:   Comprende estas instrucciones.   Controlar su enfermedad.   Solicitar ayuda de inmediato si no mejora o si empeora.  Document Released: 05/11/2005 Document Revised: 07/21/2011 [facility] Patient Information 2012 [place], [place].

## 2012-06-13 NOTE — Progress Notes (Signed)
Patient elected to not have first screen, desires quad screen when appropriate.

## 2012-06-13 NOTE — Progress Notes (Signed)
Nausea/vomiting. Will try ranitidine. No bleeding or cramping. FHTs present. Pt desires genetic testing. Discussed early vs quad screen. Pt would like to have early screen. Referred to MFM.

## 2012-06-26 ENCOUNTER — Other Ambulatory Visit (HOSPITAL_COMMUNITY): Payer: Self-pay

## 2012-07-11 ENCOUNTER — Ambulatory Visit (INDEPENDENT_AMBULATORY_CARE_PROVIDER_SITE_OTHER): Payer: Self-pay | Admitting: Advanced Practice Midwife

## 2012-07-11 VITALS — BP 92/54 | Temp 97.6°F | Wt 134.2 lb

## 2012-07-11 DIAGNOSIS — Z3480 Encounter for supervision of other normal pregnancy, unspecified trimester: Secondary | ICD-10-CM | POA: Insufficient documentation

## 2012-07-11 DIAGNOSIS — N911 Secondary amenorrhea: Secondary | ICD-10-CM

## 2012-07-11 LAB — POCT URINALYSIS DIP (DEVICE)
Ketones, ur: NEGATIVE mg/dL
Protein, ur: 30 mg/dL — AB
Specific Gravity, Urine: 1.025 (ref 1.005–1.030)
Urobilinogen, UA: 2 mg/dL — ABNORMAL HIGH (ref 0.0–1.0)
pH: 6 (ref 5.0–8.0)

## 2012-07-11 NOTE — Progress Notes (Signed)
C/O reflux, rev'd lifestyle changes, rx ranitidine. Rev'd precautions. Quad next time.

## 2012-07-11 NOTE — Progress Notes (Signed)
[id number] 

## 2012-08-01 ENCOUNTER — Encounter: Payer: Self-pay | Admitting: Advanced Practice Midwife

## 2012-08-15 NOTE — L&D Delivery Note (Signed)
Attestation of Attending Supervision of Advanced Practitioner (CNM/NP): Evaluation and management procedures were performed by the Advanced Practitioner under my supervision and collaboration.  I have reviewed the Advanced Practitioner's note and chart, and I agree with the management and plan.  HARRAWAY-SMITH, Kentrel Clevenger 1:32 PM     

## 2012-08-15 NOTE — L&D Delivery Note (Signed)
Delivery Note At 6:59 AM a viable and healthy female was delivered via Vaginal, Spontaneous Delivery (Presentation: Right Occiput Anterior with compound hand).  APGAR: 8, 9; weight .   Placenta status: Intact, Spontaneous.  Cord: 3 vessels with the following complications: shoulder cord x 1  Anesthesia: Epidural  Episiotomy: None Lacerations: abrasion Suture Repair: n/a Est. Blood Loss (mL): 250  Mom to postpartum.  Baby to nursery-stable.  [facility] 12/27/2012, 7:16 AM

## 2012-08-21 ENCOUNTER — Ambulatory Visit (HOSPITAL_COMMUNITY)
Admission: RE | Admit: 2012-08-21 | Discharge: 2012-08-21 | Disposition: A | Discharge status: Home / Self Care | Payer: Self-pay | Source: Ambulatory Visit | Attending: Obstetrics & Gynecology | Admitting: Obstetrics & Gynecology

## 2012-08-21 ENCOUNTER — Ambulatory Visit (INDEPENDENT_AMBULATORY_CARE_PROVIDER_SITE_OTHER): Payer: Self-pay | Admitting: Obstetrics & Gynecology

## 2012-08-21 VITALS — BP 96/61 | Temp 97.0°F | Ht 61.0 in | Wt 137.9 lb

## 2012-08-21 DIAGNOSIS — Z3689 Encounter for other specified antenatal screening: Secondary | ICD-10-CM | POA: Insufficient documentation

## 2012-08-21 DIAGNOSIS — G8929 Other chronic pain: Secondary | ICD-10-CM | POA: Insufficient documentation

## 2012-08-21 DIAGNOSIS — O34219 Maternal care for unspecified type scar from previous cesarean delivery: Secondary | ICD-10-CM

## 2012-08-21 DIAGNOSIS — R1011 Right upper quadrant pain: Secondary | ICD-10-CM | POA: Insufficient documentation

## 2012-08-21 DIAGNOSIS — Z3480 Encounter for supervision of other normal pregnancy, unspecified trimester: Secondary | ICD-10-CM

## 2012-08-21 DIAGNOSIS — R198 Other specified symptoms and signs involving the digestive system and abdomen: Secondary | ICD-10-CM | POA: Insufficient documentation

## 2012-08-21 LAB — POCT URINALYSIS DIP (DEVICE)
Glucose, UA: NEGATIVE mg/dL
Ketones, ur: NEGATIVE mg/dL
Protein, ur: NEGATIVE mg/dL
Urobilinogen, UA: 1 mg/dL (ref 0.0–1.0)

## 2012-08-21 NOTE — Patient Instructions (Signed)
Embarazo - Segundo trimestre (Pregnancy - Second Trimester) El segundo trimestre del embarazo (del 3 al 6mes) es un perodo de evolucin rpida para usted y el beb. Hacia el final del sexto mes, el beb mide aproximadamente 23 cm y pesa 680 g. Comenzar a sentir los movimientos del beb entre las 18 y las 20 semanas de embarazo. Podr sentir las pataditas ("quickening en ingls"). Hay un rpido aumento de peso. Puede segregar un lquido claro (calostro) de las mamas. Quizs sienta pequeas contracciones en el vientre (tero) Esto se conoce como falso trabajo de parto o contracciones de Braxton-Hicks. Es como una prctica del trabajo de parto que se produce cuando el beb est listo para salir. Generalmente los problemas de vmitos matinales ya se han superado hacia el final del primer trimestre. Algunas mujeres desarrollan pequeas manchas oscuras (que se denominan cloasma, mscara del embarazo) en la cara que normalmente se van luego del nacimiento del beb. La exposicin al sol empeora las manchas. Puede desarrollarse acn en algunas mujeres embarazadas, y puede desaparecer en aquellas que ya tienen acn. EXAMENES PRENATALES  Durante los exmenes prenatales, deber seguir realizando pruebas de sangre, segn avance el embarazo. Estas pruebas se realizan para controlar su salud y la del beb. Tambin se realizan anlisis de sangre para conocer los niveles de hemoglobina. La anemia (bajo nivel de hemoglobina) es frecuente durante el embarazo. Para prevenirla, se administran hierro y vitaminas. Tambin se le realizarn exmenes para saber si tiene diabetes entre las 24 y las 28 semanas del embarazo. Podrn repetirle algunas de las pruebas que le hicieron previamente.  En cada visita le medirn el tamao del tero. Esto se realiza para asegurarse de que el beb est creciendo correctamente de acuerdo al estado del embarazo.  Tambin en cada visita prenatal controlarn su presin arterial. Esto se realiza  para asegurarse de que no tenga toxemia.  Se controlar su orina para asegurarse de que no tenga infecciones, diabetes o protena en la orina.  Se controlar su peso regularmente para asegurarse que el aumento ocurre al ritmo indicado. Esto se hace para asegurarse que usted y el beb tienen una evolucin normal.  En algunas ocasiones se realiza una prueba de ultrasonido para confirmar el correcto desarrollo y evolucin del beb. Esta prueba se realiza con ondas sonoras inofensivas para el beb, de modo que el profesional pueda calcular ms precisamente la fecha del parto. Algunas veces se realizan pruebas especializadas del lquido amnitico que rodea al beb. Esta prueba se denomina amniocentesis. El lquido amnitico se obtiene introduciendo una aguja en el vientre (abdomen). Se realiza para controlar los cromosomas en aquellos casos en los que existe alguna preocupacin acerca de algn problema gentico que pueda sufrir el beb. En ocasiones se lleva a cabo cerca del final del embarazo, si es necesario inducir al parto. En este caso se realiza para asegurarse que los pulmones del beb estn lo suficientemente maduros como para que pueda vivir fuera del tero. CAMBIOS QUE OCURREN EN EL SEGUNDO TRIMESTRE DEL EMBARAZO Su organismo atravesar numerosos cambios durante el embarazo. Estos pueden variar de una persona a otra. Converse con el profesional que la asiste acerca los cambios que usted note y que la preocupen.  Durante el segundo trimestre probablemente sienta un aumento del apetito. Es normal tener "antojos" de ciertas comidas. Esto vara de una persona a otra y de un embarazo a otro.  El abdomen inferior comenzar a abultarse.  Podr tener la necesidad de orinar con ms frecuencia debido a que   el tero y el beb presionan sobre la vejiga. Tambin es frecuente contraer ms infecciones urinarias durante el embarazo (dolor al orinar). Puede evitarlas bebiendo gran cantidad de lquidos y vaciando  la vejiga antes y despus de mantener relaciones sexuales.  Podrn aparecer las primeras estras en las caderas, abdomen y mamas. Estos son cambios normales del cuerpo durante el embarazo. No existen medicamentos ni ejercicios que puedan prevenir estos cambios.  Es posible que comience a desarrollar venas inflamadas y abultadas (varices) en las piernas. El uso de medias de descanso, elevar sus pies durante 15 minutos, 3 a 4 veces al da y limitar la sal en su dieta ayuda a aliviar el problema.  Podr sentir acidez gstrica a medida que el tero crece y presiona contra el estmago. Puede tomar anticidos, con la autorizacin de su mdico, para aliviar este problema. Tambin es til ingerir pequeas comidas 4 a 5 veces al da.  La constipacin puede tratarse con un laxante o agregando fibra a su dieta. Beber grandes cantidades de lquidos, comer vegetales, frutas y granos integrales es de gran ayuda.  Tambin es beneficioso practicar actividad fsica. Si ha sido una persona activa hasta el embarazo, podr continuar con la mayora de las actividades durante el mismo. Si ha sido menos activa, puede ser beneficioso que comience con un programa de ejercicios, como realizar caminatas.  Puede desarrollar hemorroides (vrices en el recto) hacia el final del segundo trimestre. Tomar baos de asiento tibios y utilizar cremas recomendadas por el profesional que lo asiste sern de ayuda para los problemas de hemorroides.  Tambin podr sentir dolor de espalda durante este momento de su embarazo. Evite levantar objetos pesados, utilice zapatos de taco bajo y mantenga una buena postura para ayudar a reducir los problemas de espalda.  Algunas mujeres embarazadas desarrollan hormigueo y adormecimiento de la mano y los dedos debido a la hinchazn y compresin de los ligamentos de la mueca (sndrome del tnel carpiano). Esto desaparece una vez que el beb nace.  Como sus pechos se agrandan, necesitar un sujetador  ms grande. Use un sostn de soporte, cmodo y de algodn. No utilice un sostn para amamantar hasta el ltimo mes de embarazo si va a amamantar al beb.  Podr observar una lnea oscura desde el ombligo hacia la zona pbica denominada linea nigra.  Podr observar que sus mejillas se ponen coloradas debido al aumento de flujo sanguneo en la cara.  Podr desarrollar "araitas" en la cara, cuello y pecho. Esto desaparece una vez que el beb nace. INSTRUCCIONES PARA EL CUIDADO DOMICILIARIO  Es extremadamente importante que evite el cigarrillo, hierbas medicinales, alcohol y las drogas no prescriptas durante el embarazo. Estas sustancias qumicas afectan la formacin y el desarrollo del beb. Evite estas sustancias durante todo el embarazo para asegurar el nacimiento de un beb sano.  La mayor parte de los cuidados que se aconsejan son los mismos que los indicados para el primer trimestre del embarazo. Cumpla con las citas tal como se le indic. Siga las instrucciones del profesional que lo asiste con respecto al uso de los medicamentos, el ejercicio y la dieta.  Durante el embarazo debe obtener nutrientes para usted y para su beb. Consuma alimentos balanceados a intervalos regulares. Elija alimentos como carne, pescado, leche y otros productos lcteos descremados, vegetales, frutas, panes integrales y cereales. El profesional le informar cul es el aumento de peso ideal.  Las relaciones sexuales fsicas pueden continuarse hasta cerca del fin del embarazo si no existen otros problemas. Estos   problemas pueden ser la prdida temprana (prematura) de lquido amnitico de las membranas, sangrado vaginal, dolor abdominal u otros problemas mdicos o del embarazo.  Realice actividad fsica todos los das, si no tiene restricciones. Consulte con el profesional que la asiste si no sabe con certeza si determinados ejercicios son seguros. El mayor aumento de peso tiene lugar durante los ltimos 2 trimestres del  embarazo. El ejercicio la ayudar a:  Controlar su peso.  Ponerla en forma para el parto.  Ayudarla a perder peso luego de haber dado a luz.  Use un buen sostn o como los que se usan para hacer deportes para aliviar la sensibilidad de las mamas. Tambin puede serle til si lo usa mientras duerme. Si pierde calostro, podr utilizar apsitos en el sostn.  No utilice la baera con agua caliente, baos turcos y saunas durante el embarazo.  Utilice el cinturn de seguridad sin excepcin cuando conduzca. Este la proteger a usted y al beb en caso de accidente.  Evite comer carne cruda, queso crudo, y el contacto con los utensilios y desperdicios de los gatos. Estos elementos contienen grmenes que pueden causar defectos de nacimiento en el beb.  El segundo trimestre es un buen momento para visitar a su dentista y evaluar su salud dental si an no lo ha hecho. Es importante mantener los dientes limpios. Utilice un cepillo de dientes blando. Cepllese ms suavemente durante el embarazo.  Es ms fcil perder algo de orina durante el embarazo. Apretar y fortalecer los msculos de la pelvis la ayudar con este problema. Practique detener la miccin cuando est en el bao. Estos son los mismos msculos que necesita fortalecer. Son tambin los mismos msculos que utiliza cuando trata de evitar los gases. Puede practicar apretando estos msculos 10 veces, y repetir esto 3 veces por da aproximadamente. Una vez que conozca qu msculos debe apretar, no realice estos ejercicios durante la miccin. Puede favorecerle una infeccin si la orina vuelve hacia atrs.  Pida ayuda si tiene necesidades econmicas, de asesoramiento o nutricionales durante el embarazo. El profesional podr ayudarla con respecto a estas necesidades, o derivarla a otros especialistas.  La piel puede ponerse grasa. Si esto sucede, lvese la cara con un jabn suave, utilice un humectante no graso y maquillaje con base de aceite o  crema. CONSUMO DE MEDICAMENTOS Y DROGAS DURANTE EL EMBARAZO  Contine tomando las vitaminas apropiadas para esta etapa tal como se le indic. Las vitaminas deben contener un miligramo de cido flico y deben suplementarse con hierro. Guarde todas las vitaminas fuera del alcance de los nios. La ingestin de slo un par de vitaminas o tabletas que contengan hierro puede ocasionar la muerte en un beb o en un nio pequeo.  Evite el uso de medicamentos, inclusive los de venta libre y hierbas que no hayan sido prescriptos o indicados por el profesional que la asiste. Algunos medicamentos pueden causar problemas fsicos al beb. Utilice los medicamentos de venta libre o de prescripcin para el dolor, el malestar o la fiebre, segn se lo indique el profesional que lo asiste. No utilice aspirina.  El consumo de alcohol est relacionado con ciertos defectos de nacimiento. Esto incluye el sndrome de alcoholismo fetal. Debe evitar el consumo de alcohol en cualquiera de sus formas. El cigarrillo causa nacimientos prematuros y bebs de bajo peso. El uso de drogas recreativas est absolutamente prohibido. Son muy nocivas para el beb. Un beb que nace de una madre adicta, ser adicto al nacer. Ese beb tendr los mismos   sntomas de abstinencia que un adulto.  Infrmele al profesional si consume alguna droga.  No consuma drogas ilegales. Pueden causarle mucho dao al beb. SOLICITE ATENCIN MDICA SI: Tiene preguntas o preocupaciones durante su embarazo. Es mejor que llame para consultar las dudas que esperar hasta su prxima visita prenatal. De esta forma se sentir ms tranquila.  SOLICITE ATENCIN MDICA DE INMEDIATO SI:  La temperatura oral se eleva sin motivo por encima de 102 F (38.9 C) o segn le indique el profesional que lo asiste.  Tiene una prdida de lquido por la vagina (canal de parto). Si sospecha una ruptura de las membranas, tmese la temperatura y llame al profesional para informarlo sobre  esto.  Observa unas pequeas manchas, una hemorragia vaginal o elimina cogulos. Notifique al profesional acerca de la cantidad y de cuntos apsitos est utilizando. Unas pequeas manchas de sangre son algo comn durante el embarazo, especialmente despus de mantener relaciones sexuales.  Presenta un olor desagradable en la secrecin vaginal y observa un cambio en el color, de transparente a blanco.  Contina con las nuseas y no obtiene alivio de los remedios indicados. Vomita sangre o algo similar a la borra del caf.  [place] o sube ms de 900 g. en una semana, o segn lo indicado por el profesional que la asiste.  Observa que se le hinchan el rostro, las manos, los pies o las piernas.  Ha estado expuesta a la rubola y no ha sufrido la enfermedad.  Ha estado expuesta a la quinta enfermedad o a la varicela.  Presenta dolor abdominal. Las molestias en el ligamento redondo son una causa no cancerosa (benigna) frecuente de dolor abdominal durante el embarazo. El profesional que la asiste deber evaluarla.  Presenta dolor de cabeza intenso que no se alivia.  Presenta fiebre, diarrea, dolor al orinar o le falta la respiracin.  Presenta dificultad para ver, visin borrosa, o visin doble.  Sufre una cada, un accidente de trnsito o cualquier tipo de trauma.  Vive en un hogar en el que existe violencia fsica o mental. Document Released: 05/11/2005 Document Revised: 10/24/2011 [employer] Patient Information 2013 [employer].  

## 2012-08-21 NOTE — Progress Notes (Signed)
Patient is Spanish-speaking only, Spanish interpreter present for this encounter. She reports chronic RUQ pain, especially after meals, for several months, worsening during pregnancy.  Pain can be severe sometimes and is in her epigastric and RUQ region.  Pain is not alleviated on Zantac which she takes for GERD.  On exam, patient has a positive Murphy's sign. It was recommended that she obtain a RUQ ultrasound for further evaluation; will also check CMET, amylase and lipase today.  Lower abdominal pain is likely round ligament pain; arm and wrist numbness can happen in pregnancy due to compression of nerves around the wrist.  She was told to inform us if symptoms worsen.  Quad screen to be done today, anatomy scan also ordered.  Obstetric and pain precautions reviewed.  Will follow up all results and manage accordingly.

## 2012-08-21 NOTE — Progress Notes (Signed)
Patient does complain of pain, especially where c-section scar is.  She also states that her arms begin to go numb at random times through out the day.  Some time moving her hands in a clenching motion helps with the numbness and sometimes it doesn't.

## 2012-08-22 ENCOUNTER — Encounter: Payer: Self-pay | Admitting: Obstetrics & Gynecology

## 2012-08-22 ENCOUNTER — Telehealth: Payer: Self-pay | Admitting: *Deleted

## 2012-08-22 LAB — COMPREHENSIVE METABOLIC PANEL
AST: 16 U/L (ref 0–37)
Albumin: 4 g/dL (ref 3.5–5.2)
BUN: 5 mg/dL — ABNORMAL LOW (ref 6–23)
CO2: 26 mEq/L (ref 19–32)
Calcium: 9.3 mg/dL (ref 8.4–10.5)
Chloride: 105 mEq/L (ref 96–112)
Potassium: 3.6 mEq/L (ref 3.5–5.3)

## 2012-08-22 LAB — AMYLASE: Amylase: 60 U/L (ref 0–105)

## 2012-08-22 NOTE — Telephone Encounter (Signed)
Message copied by Gerome Apley on Wed Aug 22, 2012  8:38 AM ------      Message from: Jaynie Collins A      Created: Tue Aug 21, 2012  4:47 PM       Normal gallbladder study, please call to inform patient of results.  Patient is Spanish-speaking only, Spanish interpreter will be needed.

## 2012-08-22 NOTE — Telephone Encounter (Signed)
Called patient with interpreter Steele Berg and notified her of normal ultrasound results. No questions asked. Patient voices understanding.

## 2012-09-03 ENCOUNTER — Encounter: Payer: Self-pay | Admitting: *Deleted

## 2012-09-03 ENCOUNTER — Encounter: Payer: Self-pay | Admitting: Obstetrics & Gynecology

## 2012-09-18 ENCOUNTER — Encounter: Payer: Self-pay | Admitting: Obstetrics & Gynecology

## 2012-09-18 ENCOUNTER — Ambulatory Visit (INDEPENDENT_AMBULATORY_CARE_PROVIDER_SITE_OTHER): Payer: Self-pay | Admitting: Obstetrics & Gynecology

## 2012-09-18 VITALS — BP 109/71 | Temp 97.1°F | Wt 140.8 lb

## 2012-09-18 DIAGNOSIS — Z3480 Encounter for supervision of other normal pregnancy, unspecified trimester: Secondary | ICD-10-CM

## 2012-09-18 DIAGNOSIS — O34219 Maternal care for unspecified type scar from previous cesarean delivery: Secondary | ICD-10-CM

## 2012-09-18 LAB — POCT URINALYSIS DIP (DEVICE)
Glucose, UA: NEGATIVE mg/dL
Ketones, ur: NEGATIVE mg/dL
Protein, ur: NEGATIVE mg/dL
Urobilinogen, UA: 0.2 mg/dL (ref 0.0–1.0)

## 2012-09-18 NOTE — Progress Notes (Signed)
Pulse- 89  Pain-feet

## 2012-09-18 NOTE — Patient Instructions (Addendum)
Embarazo - Segundo trimestre (Pregnancy - Second Trimester) El segundo trimestre del embarazo (del 3 al 6mes) es un perodo de evolucin rpida para usted y el beb. Hacia el final del sexto mes, el beb mide aproximadamente 23 cm y pesa 680 g. Comenzar a sentir los movimientos del beb entre las 18 y las 20 semanas de embarazo. Podr sentir las pataditas ("quickening en ingls"). Hay un rpido aumento de peso. Puede segregar un lquido claro (calostro) de las mamas. Quizs sienta pequeas contracciones en el vientre (tero) Esto se conoce como falso trabajo de parto o contracciones de Braxton-Hicks. Es como una prctica del trabajo de parto que se produce cuando el beb est listo para salir. Generalmente los problemas de vmitos matinales ya se han superado hacia el final del primer trimestre. Algunas mujeres desarrollan pequeas manchas oscuras (que se denominan cloasma, mscara del embarazo) en la cara que normalmente se van luego del nacimiento del beb. La exposicin al sol empeora las manchas. Puede desarrollarse acn en algunas mujeres embarazadas, y puede desaparecer en aquellas que ya tienen acn. EXAMENES PRENATALES  Durante los exmenes prenatales, deber seguir realizando pruebas de sangre, segn avance el embarazo. Estas pruebas se realizan para controlar su salud y la del beb. Tambin se realizan anlisis de sangre para conocer los niveles de hemoglobina. La anemia (bajo nivel de hemoglobina) es frecuente durante el embarazo. Para prevenirla, se administran hierro y vitaminas. Tambin se le realizarn exmenes para saber si tiene diabetes entre las 24 y las 28 semanas del embarazo. Podrn repetirle algunas de las pruebas que le hicieron previamente.  En cada visita le medirn el tamao del tero. Esto se realiza para asegurarse de que el beb est creciendo correctamente de acuerdo al estado del embarazo.  Tambin en cada visita prenatal controlarn su presin arterial. Esto se realiza  para asegurarse de que no tenga toxemia.  Se controlar su orina para asegurarse de que no tenga infecciones, diabetes o protena en la orina.  Se controlar su peso regularmente para asegurarse que el aumento ocurre al ritmo indicado. Esto se hace para asegurarse que usted y el beb tienen una evolucin normal.  En algunas ocasiones se realiza una prueba de ultrasonido para confirmar el correcto desarrollo y evolucin del beb. Esta prueba se realiza con ondas sonoras inofensivas para el beb, de modo que el profesional pueda calcular ms precisamente la fecha del parto. Algunas veces se realizan pruebas especializadas del lquido amnitico que rodea al beb. Esta prueba se denomina amniocentesis. El lquido amnitico se obtiene introduciendo una aguja en el vientre (abdomen). Se realiza para controlar los cromosomas en aquellos casos en los que existe alguna preocupacin acerca de algn problema gentico que pueda sufrir el beb. En ocasiones se lleva a cabo cerca del final del embarazo, si es necesario inducir al parto. En este caso se realiza para asegurarse que los pulmones del beb estn lo suficientemente maduros como para que pueda vivir fuera del tero. CAMBIOS QUE OCURREN EN EL SEGUNDO TRIMESTRE DEL EMBARAZO Su organismo atravesar numerosos cambios durante el embarazo. Estos pueden variar de una persona a otra. Converse con el profesional que la asiste acerca los cambios que usted note y que la preocupen.  Durante el segundo trimestre probablemente sienta un aumento del apetito. Es normal tener "antojos" de ciertas comidas. Esto vara de una persona a otra y de un embarazo a otro.  El abdomen inferior comenzar a abultarse.  Podr tener la necesidad de orinar con ms frecuencia debido a que   el tero y el beb presionan sobre la vejiga. Tambin es frecuente contraer ms infecciones urinarias durante el embarazo (dolor al orinar). Puede evitarlas bebiendo gran cantidad de lquidos y vaciando  la vejiga antes y despus de mantener relaciones sexuales.  Podrn aparecer las primeras estras en las caderas, abdomen y mamas. Estos son cambios normales del cuerpo durante el embarazo. No existen medicamentos ni ejercicios que puedan prevenir estos cambios.  Es posible que comience a desarrollar venas inflamadas y abultadas (varices) en las piernas. El uso de medias de descanso, elevar sus pies durante 15 minutos, 3 a 4 veces al da y limitar la sal en su dieta ayuda a aliviar el problema.  Podr sentir acidez gstrica a medida que el tero crece y presiona contra el estmago. Puede tomar anticidos, con la autorizacin de su mdico, para aliviar este problema. Tambin es til ingerir pequeas comidas 4 a 5 veces al da.  La constipacin puede tratarse con un laxante o agregando fibra a su dieta. Beber grandes cantidades de lquidos, comer vegetales, frutas y granos integrales es de gran ayuda.  Tambin es beneficioso practicar actividad fsica. Si ha sido una persona activa hasta el embarazo, podr continuar con la mayora de las actividades durante el mismo. Si ha sido menos activa, puede ser beneficioso que comience con un programa de ejercicios, como realizar caminatas.  Puede desarrollar hemorroides (vrices en el recto) hacia el final del segundo trimestre. Tomar baos de asiento tibios y utilizar cremas recomendadas por el profesional que lo asiste sern de ayuda para los problemas de hemorroides.  Tambin podr sentir dolor de espalda durante este momento de su embarazo. Evite levantar objetos pesados, utilice zapatos de taco bajo y mantenga una buena postura para ayudar a reducir los problemas de espalda.  Algunas mujeres embarazadas desarrollan hormigueo y adormecimiento de la mano y los dedos debido a la hinchazn y compresin de los ligamentos de la mueca (sndrome del tnel carpiano). Esto desaparece una vez que el beb nace.  Como sus pechos se agrandan, necesitar un sujetador  ms grande. Use un sostn de soporte, cmodo y de algodn. No utilice un sostn para amamantar hasta el ltimo mes de embarazo si va a amamantar al beb.  Podr observar una lnea oscura desde el ombligo hacia la zona pbica denominada linea nigra.  Podr observar que sus mejillas se ponen coloradas debido al aumento de flujo sanguneo en la cara.  Podr desarrollar "araitas" en la cara, cuello y pecho. Esto desaparece una vez que el beb nace. INSTRUCCIONES PARA EL CUIDADO DOMICILIARIO  Es extremadamente importante que evite el cigarrillo, hierbas medicinales, alcohol y las drogas no prescriptas durante el embarazo. Estas sustancias qumicas afectan la formacin y el desarrollo del beb. Evite estas sustancias durante todo el embarazo para asegurar el nacimiento de un beb sano.  La mayor parte de los cuidados que se aconsejan son los mismos que los indicados para el primer trimestre del embarazo. Cumpla con las citas tal como se le indic. Siga las instrucciones del profesional que lo asiste con respecto al uso de los medicamentos, el ejercicio y la dieta.  Durante el embarazo debe obtener nutrientes para usted y para su beb. Consuma alimentos balanceados a intervalos regulares. Elija alimentos como carne, pescado, leche y otros productos lcteos descremados, vegetales, frutas, panes integrales y cereales. El profesional le informar cul es el aumento de peso ideal.  Las relaciones sexuales fsicas pueden continuarse hasta cerca del fin del embarazo si no existen otros problemas. Estos   problemas pueden ser la prdida temprana (prematura) de lquido amnitico de las membranas, sangrado vaginal, dolor abdominal u otros problemas mdicos o del embarazo.  Realice actividad fsica todos los das, si no tiene restricciones. Consulte con el profesional que la asiste si no sabe con certeza si determinados ejercicios son seguros. El mayor aumento de peso tiene lugar durante los ltimos 2 trimestres del  embarazo. El ejercicio la ayudar a:  Controlar su peso.  Ponerla en forma para el parto.  Ayudarla a perder peso luego de haber dado a luz.  Use un buen sostn o como los que se usan para hacer deportes para aliviar la sensibilidad de las mamas. Tambin puede serle til si lo usa mientras duerme. Si pierde calostro, podr utilizar apsitos en el sostn.  No utilice la baera con agua caliente, baos turcos y saunas durante el embarazo.  Utilice el cinturn de seguridad sin excepcin cuando conduzca. Este la proteger a usted y al beb en caso de accidente.  Evite comer carne cruda, queso crudo, y el contacto con los utensilios y desperdicios de los gatos. Estos elementos contienen grmenes que pueden causar defectos de nacimiento en el beb.  El segundo trimestre es un buen momento para visitar a su dentista y evaluar su salud dental si an no lo ha hecho. Es importante mantener los dientes limpios. Utilice un cepillo de dientes blando. Cepllese ms suavemente durante el embarazo.  Es ms fcil perder algo de orina durante el embarazo. Apretar y fortalecer los msculos de la pelvis la ayudar con este problema. Practique detener la miccin cuando est en el bao. Estos son los mismos msculos que necesita fortalecer. Son tambin los mismos msculos que utiliza cuando trata de evitar los gases. Puede practicar apretando estos msculos 10 veces, y repetir esto 3 veces por da aproximadamente. Una vez que conozca qu msculos debe apretar, no realice estos ejercicios durante la miccin. Puede favorecerle una infeccin si la orina vuelve hacia atrs.  Pida ayuda si tiene necesidades econmicas, de asesoramiento o nutricionales durante el embarazo. El profesional podr ayudarla con respecto a estas necesidades, o derivarla a otros especialistas.  La piel puede ponerse grasa. Si esto sucede, lvese la cara con un jabn suave, utilice un humectante no graso y maquillaje con base de aceite o  crema. CONSUMO DE MEDICAMENTOS Y DROGAS DURANTE EL EMBARAZO  Contine tomando las vitaminas apropiadas para esta etapa tal como se le indic. Las vitaminas deben contener un miligramo de cido flico y deben suplementarse con hierro. Guarde todas las vitaminas fuera del alcance de los nios. La ingestin de slo un par de vitaminas o tabletas que contengan hierro puede ocasionar la muerte en un beb o en un nio pequeo.  Evite el uso de medicamentos, inclusive los de venta libre y hierbas que no hayan sido prescriptos o indicados por el profesional que la asiste. Algunos medicamentos pueden causar problemas fsicos al beb. Utilice los medicamentos de venta libre o de prescripcin para el dolor, el malestar o la fiebre, segn se lo indique el profesional que lo asiste. No utilice aspirina.  El consumo de alcohol est relacionado con ciertos defectos de nacimiento. Esto incluye el sndrome de alcoholismo fetal. Debe evitar el consumo de alcohol en cualquiera de sus formas. El cigarrillo causa nacimientos prematuros y bebs de bajo peso. El uso de drogas recreativas est absolutamente prohibido. Son muy nocivas para el beb. Un beb que nace de una madre adicta, ser adicto al nacer. Ese beb tendr los mismos   sntomas de abstinencia que un adulto.  Infrmele al profesional si consume alguna droga.  No consuma drogas ilegales. Pueden causarle mucho dao al beb. SOLICITE ATENCIN MDICA SI: Tiene preguntas o preocupaciones durante su embarazo. Es mejor que llame para consultar las dudas que esperar hasta su prxima visita prenatal. De esta forma se sentir ms tranquila.  SOLICITE ATENCIN MDICA DE INMEDIATO SI:  La temperatura oral se eleva sin motivo por encima de 102 F (38.9 C) o segn le indique el profesional que lo asiste.  Tiene una prdida de lquido por la vagina (canal de parto). Si sospecha una ruptura de las membranas, tmese la temperatura y llame al profesional para informarlo sobre  esto.  Observa unas pequeas manchas, una hemorragia vaginal o elimina cogulos. Notifique al profesional acerca de la cantidad y de cuntos apsitos est utilizando. Unas pequeas manchas de sangre son algo comn durante el embarazo, especialmente despus de mantener relaciones sexuales.  Presenta un olor desagradable en la secrecin vaginal y observa un cambio en el color, de transparente a blanco.  Contina con las nuseas y no obtiene alivio de los remedios indicados. Vomita sangre o algo similar a la borra del caf.  [place] o sube ms de 900 g. en una semana, o segn lo indicado por el profesional que la asiste.  Observa que se le hinchan el rostro, las manos, los pies o las piernas.  Ha estado expuesta a la rubola y no ha sufrido la enfermedad.  Ha estado expuesta a la quinta enfermedad o a la varicela.  Presenta dolor abdominal. Las molestias en el ligamento redondo son una causa no cancerosa (benigna) frecuente de dolor abdominal durante el embarazo. El profesional que la asiste deber evaluarla.  Presenta dolor de cabeza intenso que no se alivia.  Presenta fiebre, diarrea, dolor al orinar o le falta la respiracin.  Presenta dificultad para ver, visin borrosa, o visin doble.  Sufre una cada, un accidente de trnsito o cualquier tipo de trauma.  Vive en un hogar en el que existe violencia fsica o mental. Document Released: 05/11/2005 Document Revised: 10/24/2011 [employer] Patient Information 2013 [employer].  

## 2012-09-18 NOTE — Progress Notes (Signed)
Pt with no complaints.  Exam completed using interpreter.

## 2012-10-17 ENCOUNTER — Ambulatory Visit (INDEPENDENT_AMBULATORY_CARE_PROVIDER_SITE_OTHER): Payer: Self-pay | Admitting: Family

## 2012-10-17 VITALS — BP 96/64 | Temp 98.1°F | Wt 145.0 lb

## 2012-10-17 DIAGNOSIS — Z3493 Encounter for supervision of normal pregnancy, unspecified, third trimester: Secondary | ICD-10-CM

## 2012-10-17 DIAGNOSIS — Z3480 Encounter for supervision of other normal pregnancy, unspecified trimester: Secondary | ICD-10-CM

## 2012-10-17 LAB — POCT URINALYSIS DIP (DEVICE)
Bilirubin Urine: NEGATIVE
Glucose, UA: NEGATIVE mg/dL
Leukocytes, UA: NEGATIVE
Nitrite: NEGATIVE
Urobilinogen, UA: 0.2 mg/dL (ref 0.0–1.0)

## 2012-10-17 NOTE — Progress Notes (Signed)
Pt with no complaints today. Discussed risks and benefits of VBAC with patient and she is interested in this. Pt given consent form to initial and sign. Pt filling out release of information to get records from [facility] for evaluation of previous vaginal delivery attempt and subsequent c-section.  1 hr GTT obtained today.

## 2012-10-17 NOTE — Progress Notes (Signed)
Pulse: 120

## 2012-10-18 LAB — GLUCOSE TOLERANCE, 1 HOUR (50G) W/O FASTING: Glucose, 1 Hour GTT: 122 mg/dL (ref 70–140)

## 2012-10-19 ENCOUNTER — Encounter: Payer: Self-pay | Admitting: Family

## 2012-10-22 ENCOUNTER — Encounter: Payer: Self-pay | Admitting: *Deleted

## 2012-10-31 ENCOUNTER — Ambulatory Visit (INDEPENDENT_AMBULATORY_CARE_PROVIDER_SITE_OTHER): Payer: Self-pay | Admitting: Family Medicine

## 2012-10-31 VITALS — BP 108/77 | Temp 97.8°F | Wt 147.7 lb

## 2012-10-31 DIAGNOSIS — Z3480 Encounter for supervision of other normal pregnancy, unspecified trimester: Secondary | ICD-10-CM

## 2012-10-31 LAB — POCT URINALYSIS DIP (DEVICE)
Bilirubin Urine: NEGATIVE
Glucose, UA: NEGATIVE mg/dL
Leukocytes, UA: NEGATIVE
Nitrite: NEGATIVE
Urobilinogen, UA: 1 mg/dL (ref 0.0–1.0)

## 2012-10-31 MED ORDER — LANSOPRAZOLE 15 MG PO CPDR: 15.0000 mg | DELAYED_RELEASE_CAPSULE | Freq: Every day | ORAL | Status: DC

## 2012-10-31 NOTE — Progress Notes (Signed)
Pulse-102 Pt c/o vomiting x 2 and having diarrhea

## 2012-10-31 NOTE — Patient Instructions (Addendum)
Pregnancy - Third Trimester  The third trimester of pregnancy (the last 3 months) is a period of the most rapid growth for you and your baby. The baby approaches a length of 20 inches and a weight of 6 to 10 pounds. The baby is adding on fat and getting ready for life outside your body. While inside, babies have periods of sleeping and waking, suck their thumbs, and hiccups. You can often feel small contractions of the uterus. This is false labor. It is also called Braxton-Hicks contractions. This is like a practice for labor. The usual problems in this stage of pregnancy include more difficulty breathing, swelling of the hands and feet from water retention, and having to urinate more often because of the uterus and baby pressing on your bladder.   PRENATAL EXAMS  · Blood work may continue to be done during prenatal exams. These tests are done to check on your health and the probable health of your baby. Blood work is used to follow your blood levels (hemoglobin). Anemia (low hemoglobin) is common during pregnancy. Iron and vitamins are given to help prevent this. You may also continue to be checked for diabetes. Some of the past blood tests may be done again.  · The size of the uterus is measured during each visit. This makes sure your baby is growing properly according to your pregnancy dates.  · Your blood pressure is checked every prenatal visit. This is to make sure you are not getting toxemia.  · Your urine is checked every prenatal visit for infection, diabetes and protein.  · Your weight is checked at each visit. This is done to make sure gains are happening at the suggested rate and that you and your baby are growing normally.  · Sometimes, an ultrasound is performed to confirm the position and the proper growth and development of the baby. This is a test done that bounces harmless sound waves off the baby so your caregiver can more accurately determine due dates.  · Discuss the type of pain medication and  anesthesia you will have during your labor and delivery.  · Discuss the possibility and anesthesia if a Cesarean Section might be necessary.  · Inform your caregiver if there is any mental or physical violence at home.  Sometimes, a specialized non-stress test, contraction stress test and biophysical profile are done to make sure the baby is not having a problem. Checking the amniotic fluid surrounding the baby is called an amniocentesis. The amniotic fluid is removed by sticking a needle into the belly (abdomen). This is sometimes done near the end of pregnancy if an early delivery is required. In this case, it is done to help make sure the baby's lungs are mature enough for the baby to live outside of the womb. If the lungs are not mature and it is unsafe to deliver the baby, an injection of cortisone medication is given to the mother 1 to 2 days before the delivery. This helps the baby's lungs mature and makes it safer to deliver the baby.  CHANGES OCCURING IN THE THIRD TRIMESTER OF PREGNANCY  Your body goes through many changes during pregnancy. They vary from person to person. Talk to your caregiver about changes you notice and are concerned about.  · During the last trimester, you have probably had an increase in your appetite. It is normal to have cravings for certain foods. This varies from person to person and pregnancy to pregnancy.  · You may begin to   get stretch marks on your hips, abdomen, and breasts. These are normal changes in the body during pregnancy. There are no exercises or medications to take which prevent this change.  · Constipation may be treated with a stool softener or adding bulk to your diet. Drinking lots of fluids, fiber in vegetables, fruits, and whole grains are helpful.  · Exercising is also helpful. If you have been very active up until your pregnancy, most of these activities can be continued during your pregnancy. If you have been less active, it is helpful to start an exercise  program such as walking. Consult your caregiver before starting exercise programs.  · Avoid all smoking, alcohol, un-prescribed drugs, herbs and "street drugs" during your pregnancy. These chemicals affect the formation and growth of the baby. Avoid chemicals throughout the pregnancy to ensure the delivery of a healthy infant.  · Backache, varicose veins and hemorrhoids may develop or get worse.  · You will tire more easily in the third trimester, which is normal.  · The baby's movements may be stronger and more often.  · You may become short of breath easily.  · Your belly button may stick out.  · A yellow discharge may leak from your breasts called colostrum.  · You may have a bloody mucus discharge. This usually occurs a few days to a week before labor begins.  HOME CARE INSTRUCTIONS   · Keep your caregiver's appointments. Follow your caregiver's instructions regarding medication use, exercise, and diet.  · During pregnancy, you are providing food for you and your baby. Continue to eat regular, well-balanced meals. Choose foods such as meat, fish, milk and other low fat dairy products, vegetables, fruits, and whole-grain breads and cereals. Your caregiver will tell you of the ideal weight gain.  · A physical sexual relationship may be continued throughout pregnancy if there are no other problems such as early (premature) leaking of amniotic fluid from the membranes, vaginal bleeding, or belly (abdominal) pain.  · Exercise regularly if there are no restrictions. Check with your caregiver if you are unsure of the safety of your exercises. Greater weight gain will occur in the last 2 trimesters of pregnancy. Exercising helps:  · Control your weight.  · Get you in shape for labor and delivery.  · You lose weight after you deliver.  · Rest a lot with legs elevated, or as needed for leg cramps or low back pain.  · Wear a good support or jogging bra for breast tenderness during pregnancy. This may help if worn during  sleep. Pads or tissues may be used in the bra if you are leaking colostrum.  · Do not use hot tubs, steam rooms, or saunas.  · Wear your seat belt when driving. This protects you and your baby if you are in an accident.  · Avoid raw meat, cat litter boxes and soil used by cats. These carry germs that can cause birth defects in the baby.  · It is easier to loose urine during pregnancy. Tightening up and strengthening the pelvic muscles will help with this problem. You can practice stopping your urination while you are going to the bathroom. These are the same muscles you need to strengthen. It is also the muscles you would use if you were trying to stop from passing gas. You can practice tightening these muscles up 10 times a set and repeating this about 3 times per day. Once you know what muscles to tighten up, do not perform these   exercises during urination. It is more likely to cause an infection by backing up the urine.  · Ask for help if you have financial, counseling or nutritional needs during pregnancy. Your caregiver will be able to offer counseling for these needs as well as refer you for other special needs.  · Make a list of emergency phone numbers and have them available.  · Plan on getting help from family or friends when you go home from the hospital.  · Make a trial run to the hospital.  · Take prenatal classes with the father to understand, practice and ask questions about the labor and delivery.  · Prepare the baby's room/nursery.  · Do not travel out of the city unless it is absolutely necessary and with the advice of your caregiver.  · Wear only low or no heal shoes to have better balance and prevent falling.  MEDICATIONS AND DRUG USE IN PREGNANCY  · Take prenatal vitamins as directed. The vitamin should contain 1 milligram of folic acid. Keep all vitamins out of reach of children. Only a couple vitamins or tablets containing iron may be fatal to a baby or young child when ingested.  · Avoid use  of all medications, including herbs, over-the-counter medications, not prescribed or suggested by your caregiver. Only take over-the-counter or prescription medicines for pain, discomfort, or fever as directed by your caregiver. Do not use aspirin, ibuprofen (Motrin®, Advil®, Nuprin®) or naproxen (Aleve®) unless OK'd by your caregiver.  · Let your caregiver also know about herbs you may be using.  · Alcohol is related to a number of birth defects. This includes fetal alcohol syndrome. All alcohol, in any form, should be avoided completely. Smoking will cause low birth rate and premature babies.  · Street/illegal drugs are very harmful to the baby. They are absolutely forbidden. A baby born to an addicted mother will be addicted at birth. The baby will go through the same withdrawal an adult does.  SEEK MEDICAL CARE IF:  You have any concerns or worries during your pregnancy. It is better to call with your questions if you feel they cannot wait, rather than worry about them.  DECISIONS ABOUT CIRCUMCISION  You may or may not know the sex of your baby. If you know your baby is a boy, it may be time to think about circumcision. Circumcision is the removal of the foreskin of the penis. This is the skin that covers the sensitive end of the penis. There is no proven medical need for this. Often this decision is made on what is popular at the time or based upon religious beliefs and social issues. You can discuss these issues with your caregiver or pediatrician.  SEEK IMMEDIATE MEDICAL CARE IF:   · An unexplained oral temperature above 102° F (38.9° C) develops, or as your caregiver suggests.  · You have leaking of fluid from the vagina (birth canal). If leaking membranes are suspected, take your temperature and tell your caregiver of this when you call.  · There is vaginal spotting, bleeding or passing clots. Tell your caregiver of the amount and how many pads are used.  · You develop a bad smelling vaginal discharge with  a change in the color from clear to white.  · You develop vomiting that lasts more than 24 hours.  · You develop chills or fever.  · You develop shortness of breath.  · You develop burning on urination.  · You loose more than 2 pounds of weight   or gain more than 2 pounds of weight or as suggested by your caregiver.  · You notice sudden swelling of your face, hands, and feet or legs.  · You develop belly (abdominal) pain. Round ligament discomfort is a common non-cancerous (benign) cause of abdominal pain in pregnancy. Your caregiver still must evaluate you.  · You develop a severe headache that does not go away.  · You develop visual problems, blurred or double vision.  · If you have not felt your baby move for more than 1 hour. If you think the baby is not moving as much as usual, eat something with sugar in it and lie down on your left side for an hour. The baby should move at least 4 to 5 times per hour. Call right away if your baby moves less than that.  · You fall, are in a car accident or any kind of trauma.  · There is mental or physical violence at home.  Document Released: 07/26/2001 Document Revised: 10/24/2011 Document Reviewed: 01/28/2009  [employer]® Patient Information ©2013 [employer].

## 2012-10-31 NOTE — Progress Notes (Signed)
C/section with first delivery for failure to progress, baby 8#10 oz. Would like TOLAC. Discussed risks/benefits. Signed consent. Still uncertain as family advising repeat c/section.  C/o some nausea and dizziness. Discussed adequate hydration. Nausea/vomiting sounds like reflux. Tried zantac but stopped b/c heartburn resolved. Will try prevacid.

## 2012-11-07 ENCOUNTER — Encounter: Payer: Self-pay | Admitting: *Deleted

## 2012-11-07 ENCOUNTER — Encounter: Payer: Self-pay | Admitting: Advanced Practice Midwife

## 2012-11-14 ENCOUNTER — Ambulatory Visit (INDEPENDENT_AMBULATORY_CARE_PROVIDER_SITE_OTHER): Payer: Self-pay | Admitting: Family Medicine

## 2012-11-14 VITALS — BP 103/67 | Temp 98.9°F | Wt 148.2 lb

## 2012-11-14 DIAGNOSIS — O34219 Maternal care for unspecified type scar from previous cesarean delivery: Secondary | ICD-10-CM

## 2012-11-14 DIAGNOSIS — Z3483 Encounter for supervision of other normal pregnancy, third trimester: Secondary | ICD-10-CM

## 2012-11-14 LAB — POCT URINALYSIS DIP (DEVICE)
Bilirubin Urine: NEGATIVE
Glucose, UA: NEGATIVE mg/dL
Ketones, ur: NEGATIVE mg/dL
Nitrite: NEGATIVE
pH: 7 (ref 5.0–8.0)

## 2012-11-14 NOTE — Progress Notes (Signed)
Pulse = 112 

## 2012-11-14 NOTE — Patient Instructions (Addendum)
Embarazo  Tercer trimestre  (Pregnancy - Third Trimester) El tercer trimestre del embarazo (los ltimos 3 meses) es el perodo en el cual tanto usted como su beb crecen con ms rapidez. El beb alcanza un largo de aproximadamente 50 cm. y pesa entre 2,700 y 4,500 kg. El beb gana ms tejido graso y est listo para la vida fuera del cuerpo de la madre. Mientras estn en el interior, los bebs tienen perodos de sueo y vigilia, succionan el pulgar y tienen hipo. Quizs sienta pequeas contracciones del tero. Este es el falso trabajo de parto. Tambin se las conoce como contracciones de Braxton-Hicks . Es como una prctica del parto. Los problemas ms habituales de esta etapa del embarazo incluyen mayor dificultad para respirar, hinchazn de las manos y los pies por retencin de lquidos y la necesidad de orinar con ms frecuencia debido a que el tero y el beb presionan sobre la vejiga.  EXAMENES PRENATALES   Durante los exmenes prenatales, deber seguir realizndose anlisis de sangre. Estas pruebas se realizan para controlar su salud y la del beb. Los anlisis de sangre se realizan para conocer los niveles de algunos compuestos de la sangre (hemoglobina). La anemia (bajo nivel de hemoglobina) es frecuente durante el embarazo. Para prevenirla, se administran hierro y vitaminas. Tambin le tomarn nuevas anlisis para descartar diabetes. Podrn repetirle algunas de las pruebas que le hicieron previamente.  En cada visita le medirn el tamao del tero. Esto permite asegurar que el beb se desarrolla adecuadamente, segn la fecha del embarazo.  Le controlarn la presin arterial en cada visita prenatal. Esto es para asegurarse de que no sufre toxemia.  Le harn un anlisis de orina en cada visita prenatal, para descartar infecciones, diabetes y la presencia de protenas.  Tambin en cada visita controlarn su peso. Esto se realiza para asegurarse que aumenta de peso al ritmo indicado y que usted y su  beb evolucionan normalmente.  En algunas ocasiones se realiza una prueba de ultrasonido para confirmar el correcto desarrollo y evolucin del beb. Esta prueba se realiza con ondas sonoras inofensivas para el beb, de modo que el profesional pueda calcular ms precisamente la fecha del parto.  Analice con su mdico los analgsicos y la anestesia que recibir durante el trabajo de parto y el parto.  Comente la posibilidad de que necesite una cesrea y qu anestesia se recibir.  Informe a su mdico si sufre violencia familiar mental o fsica. A veces, se indica la prueba especializada sin estrs, la prueba de tolerancia a las contracciones y el perfil biofsico para asegurarse de que el beb no tiene problemas. El estudio del lquido amnitico que rodea al beb se llama amniocentesis. El lquido amnitico se obtiene introduciendo una aguja en el vientre (abdomen ). En ocasiones se lleva a cabo cerca del final del embarazo, si es necesario inducir a un parto. En este caso se realiza para asegurarse que los pulmones del beb estn lo suficientemente maduros como para que pueda vivir fuera del tero. Si los pulmones no han madurado y es peligroso que el beb nazca, se administrar a la madre una inyeccin de cortisona , 1 a 2 das antes del parto. . Esto ayuda a que los pulmones del beb maduren y sea ms seguro su nacimiento.  CAMBIOS QUE OCURREN EN EL TERCER TRIMESTRE DEL EMBARAZO  Su organismo atravesar numerosos cambios durante el embarazo. Estos pueden variar de una persona a otra. Converse con el profesional que la asiste acerca los cambios que   usted note y que la preocupen.   Durante el ltimo trimestre probablemente sienta un aumento del apetito. Es normal tener "antojos" de ciertas comidas. Esto vara de una persona a otra y de un embarazo a otro.  Podrn aparecer las primeras estras en las caderas, abdomen y mamas. Estos son cambios normales del cuerpo durante el embarazo. No existen  medicamentos ni ejercicios que puedan prevenir estos cambios.  La constipacin puede tratarse con un laxante o agregando fibra a su dieta. Beber grandes cantidades de lquidos, tomar fibras en forma de vegetales, frutas y granos integrales es de gran ayuda.  Tambin es beneficioso practicar actividad fsica. Si ha sido una persona activa hasta el embarazo, podr continuar con la mayora de las actividades durante el mismo. Si ha sido menos activa, puede ser beneficioso que comience con un programa de ejercicios, como realizar caminatas. Consulte con el profesional que la asiste antes de comenzar un programa de ejercicios.  Evite el consumo de cigarrillos, el alcohol, los medicamentos no recetados y las "drogas de la calle" durante el embarazo. Estas sustancias qumicas afectan la formacin y el desarrollo del beb. Evite estas sustancias durante todo el embarazo para asegurar el nacimiento de un beb sano.  Podr sentir dolor de espalda, tener vrices en las venas y hemorroides, o si ya los sufra, pueden empeorar.  Durante el tercer trimestre se cansar con ms facilidad, lo cual es normal.  Los movimientos del beb pueden ser ms fuertes y con ms frecuencia.  Puede que note dificultades para respirar normalmente.  El ombligo puede salir hacia afuera.  A veces sale una secrecin amarilla de las mamas, que se llama calostro.  Podr aparecer una secrecin mucosa con sangre. Esto suele ocurrir entre unos pocos das y una semana antes del parto. INSTRUCCIONES PARA EL CUIDADO EN EL HOGAR   Cumpla con las citas de control. Siga las indicaciones del mdico con respecto al uso de medicamentos, los ejercicios y la dieta.  Durante el embarazo debe obtener nutrientes para usted y para su beb. Consuma alimentos balanceados a intervalos regulares. Elija alimentos como carne, pescado, leche y otros productos lcteos descremados, vegetales, frutas, panes integrales y cereales. El mdico le informar  cul es el aumento de peso ideal.  Las relaciones sexuales pueden continuarse hasta casi el final del embarazo, si no se presentan otros problemas como prdida prematura (antes de tiempo) de lquido amnitico, hemorragia vaginal o dolor en el vientre (abdominal).  Realice actividad fsica todos los das, si no tiene restricciones. Consulte con el profesional que la asiste si no sabe con certeza si determinados ejercicios son seguros. El mayor aumento de peso se producir en los ltimos 2 trimestres del embarazo. El ejercicio ayuda a:  Controlar su peso.  Mantenerse en forma para el trabajo de parto y el parto .  Perder peso despus del parto.  Haga reposo con frecuencia, con las piernas elevadas, o segn lo necesite para evitar los calambres y el dolor de cintura.  Use un buen sostn o como los que se usan para hacer deportes para aliviar la sensibilidad de las mamas. Tambin puede serle til si lo usa mientras duerme. Si pierde calostro, podr utilizar apsitos en el sostn.  No utilice la baera con agua caliente, baos turcos y saunas.  Colquese el cinturn de seguridad cuando conduzca. Este la proteger a usted y al beb en caso de accidente.  Evite comer carne cruda y el contacto con los utensilios y desperdicios de los gatos. Estos elementos   contienen grmenes que pueden causar defectos de nacimiento en el beb.  Es fcil perder algo de orina durante el embarazo. Apretar y fortalecer los msculos de la pelvis la ayudar con este problema. Practique detener la miccin cuando est en el bao. Estos son los mismos msculos que necesita fortalecer. Son tambin los mismos msculos que utiliza cuando trata de evitar despedir gases. Puede practicar apretando estos msculos diez veces, y repetir esto tres veces por da aproximadamente. Una vez que conozca qu msculos debe apretar, no realice estos ejercicios durante la miccin. Puede favorecerle una infeccin si la orina vuelve hacia  atrs.  Pida ayuda si tienen necesidades financieras, teraputicas o nutricionales. El profesional podr ayudarla con respecto a estas necesidades, o derivarla a otros especialistas.  Haga una lista de nmeros telefnicos de emergencia y tngalos disponibles.  Planifique como obtener ayuda de familiares o amigos cuando regrese a casa desde el hospital.  Hacer un ensayo sobre la partida al hospital.  Tome clases prenatales con el padre para entender, practicar y hacer preguntas sobre el trabajo de parto y el alumbramiento.  Preparar la habitacin del beb / busque una guardera.  No viaje fuera de la ciudad a menos que sea absolutamente necesario y con el asesoramiento de su mdico.  Use slo zapatos de tacn bajo o sin tacn para tener mejor equilibrio y evitar cadas. USO DE MEDICAMENTOS Y CONSUMO DE DROGAS DURANTE EL EMBARAZO   Tome las vitaminas apropiadas para esta etapa tal como se le indic. Las vitaminas deben contener un miligramo de cido flico. Guarde todas las vitaminas fuera del alcance de los nios. La ingestin de slo un par de vitaminas o tabletas que contengan hierro pueden ocasionar la muerte en un beb o en un nio pequeo.  Evite el uso de todos los medicamentos, incluyendo hierbas, medicamentos de venta libre, sin receta o que no hayan sido sugeridos por su mdico. Slo tome medicamentos de venta libre o medicamentos recetados para el dolor, el malestar o fiebre como lo indique su mdico. No tome aspirina, ibuprofeno (Motrin, Advil, Nuprin) o naproxeno (Aleve) excepto que su mdico se lo indique.  Infrmele al profesional si consume alguna droga.  El alcohol se relaciona con ciertos defectos congnitos. Incluye el sndrome de alcoholismo fetal. Debe evitar absolutamente el consumo de alcohol, en cualquier forma. El fumar produce baja tasa de natalidad y bebs prematuros.  Las drogas ilegales o de la calle son muy perjudiciales para el beb. Estn absolutamente  prohibidas. Un beb que nace de una madre adicta, ser adicto al nacer. Ese beb tendr los mismos sntomas de abstinencia que un adulto. SOLICITE ATENCIN MDICA SI:  Tiene preguntas o preocupaciones relacionadas con el embarazo. Es mejor que llame para formular las preguntas si no puede esperar hasta la prxima visita, que sentirse preocupada por ellas.  DECISIONES ACERCA DE LA CIRCUNCISIN  Usted puede saber o no cul es el sexo de su beb. Si ya sabe que ser un varn, este es el momento de pensar acerca de la circuncisin. La circuncisin es la extirpacin del prepucio. Esta es la piel que cubre el extremo sensible del pene. No hay un motivo mdico que lo justifique. Generalmente la decisin se toma segn lo que sea popular en ese momento, o segn creencias religiosas. Podr conversar estos temas con su mdico o con el pediatra.  SOLICITE ATENCIN MDICA DE INMEDIATO SI:   La temperatura oral le sube a ms de 102 F (38.9 C) o lo que su mdico le   indique.  Tiene una prdida de lquido por la vagina (canal de parto). Si sospecha una ruptura de las membranas, tmese la temperatura y llame al profesional para informarlo sobre esto.  Observa unas pequeas manchas, una hemorragia vaginal o elimina cogulos. Notifique al profesional acerca de la cantidad y de cuntos apsitos est utilizando.  Presenta un olor desagradable en la secrecin vaginal y observa un cambio en el color, de transparente a blanco.  Ha vomitado durante ms de 24 horas.  Siente escalofros o le sube la fiebre.  Le falta el aire.  Siente ardor al orinar.  Baja o sube ms de 2 libras (900 g), o segn lo indicado por el profesional que la asiste.  Observa que sbitamente se le hinchan el rostro, las manos, los pies o las piernas.  Siente dolor en el vientre (abdominal). Las molestias en el ligamento redondo son una causa benigna frecuente de dolor abdominal durante el embarazo. El profesional que la asiste deber  evaluarla.  Presenta dolor de cabeza intenso que no se alivia.  Tiene problemas visuales, visin doble o borrosa.  Si no siente los movimientos del beb durante ms de 1 hora. Si piensa que el beb no se mueve tanto como lo haca habitualmente, coma algo que contenga azcar y recustese sobre el lado izquierdo durante una hora. El beb debe moverse al menos 4  5 veces por hora. Comunquese inmediatamente si el beb se mueve menos que lo indicado.  Se cae, se ve involucrada en un accidente automovilstico o sufre algn tipo de traumatismo.  En su hogar hay violencia mental o fsica. Document Released: 05/11/2005 Document Revised: 01/31/2012 [employer] Patient Information 2013 [employer].  

## 2012-11-14 NOTE — Progress Notes (Signed)
No concerns. 

## 2012-12-05 ENCOUNTER — Encounter: Payer: Self-pay | Admitting: Advanced Practice Midwife

## 2012-12-05 ENCOUNTER — Ambulatory Visit (INDEPENDENT_AMBULATORY_CARE_PROVIDER_SITE_OTHER): Payer: Self-pay | Admitting: Advanced Practice Midwife

## 2012-12-05 VITALS — BP 108/74 | Wt 152.8 lb

## 2012-12-05 DIAGNOSIS — O34219 Maternal care for unspecified type scar from previous cesarean delivery: Secondary | ICD-10-CM

## 2012-12-05 LAB — POCT URINALYSIS DIP (DEVICE)
Bilirubin Urine: NEGATIVE
Glucose, UA: NEGATIVE mg/dL
Ketones, ur: NEGATIVE mg/dL
Protein, ur: NEGATIVE mg/dL

## 2012-12-05 LAB — OB RESULTS CONSOLE GC/CHLAMYDIA: Chlamydia: NEGATIVE

## 2012-12-05 MED ORDER — ZOLPIDEM TARTRATE 10 MG PO TABS: 5.0000 mg | ORAL_TABLET | Freq: Every evening | ORAL | Status: DC | PRN

## 2012-12-05 NOTE — Progress Notes (Signed)
Pulse:114 Complains of low pain under her belly. Hasn't been able to sleep much in past two weeks due to pain. Has a lot of clear discharge. States that baby moves too  Much.

## 2012-12-05 NOTE — Progress Notes (Signed)
I saw and examined patient along with student and agree with above note.   Alison Zuniga 12/05/2012 10:40 AM

## 2012-12-05 NOTE — Progress Notes (Signed)
Complains of lower abdominal pain bilaterally in area of lateral edges of C/S scar. Additionally feels like baby is dropping lots and moving too much, which has caused her to have difficulty sleeping for the past 2 weeks. Pain is likely related to baby dropping and stretching of ligaments. Will send Rx for ambien to help with sleep. Some clear discharge noted without odor or itching. Will need GC/Chlam/GBS at next visit. To f/u in 1 week.

## 2012-12-05 NOTE — Addendum Note (Signed)
Addended by: Franchot Mimes on: 12/05/2012 11:06 AM   Modules accepted: Orders

## 2012-12-05 NOTE — Patient Instructions (Addendum)
Pregnancy - Third Trimester  The third trimester of pregnancy (the last 3 months) is a period of the most rapid growth for you and your baby. The baby approaches a length of 20 inches and a weight of 6 to 10 pounds. The baby is adding on fat and getting ready for life outside your body. While inside, babies have periods of sleeping and waking, suck their thumbs, and hiccups. You can often feel small contractions of the uterus. This is false labor. It is also called Braxton-Hicks contractions. This is like a practice for labor. The usual problems in this stage of pregnancy include more difficulty breathing, swelling of the hands and feet from water retention, and having to urinate more often because of the uterus and baby pressing on your bladder.   PRENATAL EXAMS  · Blood work may continue to be done during prenatal exams. These tests are done to check on your health and the probable health of your baby. Blood work is used to follow your blood levels (hemoglobin). Anemia (low hemoglobin) is common during pregnancy. Iron and vitamins are given to help prevent this. You may also continue to be checked for diabetes. Some of the past blood tests may be done again.  · The size of the uterus is measured during each visit. This makes sure your baby is growing properly according to your pregnancy dates.  · Your blood pressure is checked every prenatal visit. This is to make sure you are not getting toxemia.  · Your urine is checked every prenatal visit for infection, diabetes and protein.  · Your weight is checked at each visit. This is done to make sure gains are happening at the suggested rate and that you and your baby are growing normally.  · Sometimes, an ultrasound is performed to confirm the position and the proper growth and development of the baby. This is a test done that bounces harmless sound waves off the baby so your caregiver can more accurately determine due dates.  · Discuss the type of pain medication and  anesthesia you will have during your labor and delivery.  · Discuss the possibility and anesthesia if a Cesarean Section might be necessary.  · Inform your caregiver if there is any mental or physical violence at home.  Sometimes, a specialized non-stress test, contraction stress test and biophysical profile are done to make sure the baby is not having a problem. Checking the amniotic fluid surrounding the baby is called an amniocentesis. The amniotic fluid is removed by sticking a needle into the belly (abdomen). This is sometimes done near the end of pregnancy if an early delivery is required. In this case, it is done to help make sure the baby's lungs are mature enough for the baby to live outside of the womb. If the lungs are not mature and it is unsafe to deliver the baby, an injection of cortisone medication is given to the mother 1 to 2 days before the delivery. This helps the baby's lungs mature and makes it safer to deliver the baby.  CHANGES OCCURING IN THE THIRD TRIMESTER OF PREGNANCY  Your body goes through many changes during pregnancy. They vary from person to person. Talk to your caregiver about changes you notice and are concerned about.  · During the last trimester, you have probably had an increase in your appetite. It is normal to have cravings for certain foods. This varies from person to person and pregnancy to pregnancy.  · You may begin to   get stretch marks on your hips, abdomen, and breasts. These are normal changes in the body during pregnancy. There are no exercises or medications to take which prevent this change.  · Constipation may be treated with a stool softener or adding bulk to your diet. Drinking lots of fluids, fiber in vegetables, fruits, and whole grains are helpful.  · Exercising is also helpful. If you have been very active up until your pregnancy, most of these activities can be continued during your pregnancy. If you have been less active, it is helpful to start an exercise  program such as walking. Consult your caregiver before starting exercise programs.  · Avoid all smoking, alcohol, un-prescribed drugs, herbs and "street drugs" during your pregnancy. These chemicals affect the formation and growth of the baby. Avoid chemicals throughout the pregnancy to ensure the delivery of a healthy infant.  · Backache, varicose veins and hemorrhoids may develop or get worse.  · You will tire more easily in the third trimester, which is normal.  · The baby's movements may be stronger and more often.  · You may become short of breath easily.  · Your belly button may stick out.  · A yellow discharge may leak from your breasts called colostrum.  · You may have a bloody mucus discharge. This usually occurs a few days to a week before labor begins.  HOME CARE INSTRUCTIONS   · Keep your caregiver's appointments. Follow your caregiver's instructions regarding medication use, exercise, and diet.  · During pregnancy, you are providing food for you and your baby. Continue to eat regular, well-balanced meals. Choose foods such as meat, fish, milk and other low fat dairy products, vegetables, fruits, and whole-grain breads and cereals. Your caregiver will tell you of the ideal weight gain.  · A physical sexual relationship may be continued throughout pregnancy if there are no other problems such as early (premature) leaking of amniotic fluid from the membranes, vaginal bleeding, or belly (abdominal) pain.  · Exercise regularly if there are no restrictions. Check with your caregiver if you are unsure of the safety of your exercises. Greater weight gain will occur in the last 2 trimesters of pregnancy. Exercising helps:  · Control your weight.  · Get you in shape for labor and delivery.  · You lose weight after you deliver.  · Rest a lot with legs elevated, or as needed for leg cramps or low back pain.  · Wear a good support or jogging bra for breast tenderness during pregnancy. This may help if worn during  sleep. Pads or tissues may be used in the bra if you are leaking colostrum.  · Do not use hot tubs, steam rooms, or saunas.  · Wear your seat belt when driving. This protects you and your baby if you are in an accident.  · Avoid raw meat, cat litter boxes and soil used by cats. These carry germs that can cause birth defects in the baby.  · It is easier to loose urine during pregnancy. Tightening up and strengthening the pelvic muscles will help with this problem. You can practice stopping your urination while you are going to the bathroom. These are the same muscles you need to strengthen. It is also the muscles you would use if you were trying to stop from passing gas. You can practice tightening these muscles up 10 times a set and repeating this about 3 times per day. Once you know what muscles to tighten up, do not perform these   exercises during urination. It is more likely to cause an infection by backing up the urine.  · Ask for help if you have financial, counseling or nutritional needs during pregnancy. Your caregiver will be able to offer counseling for these needs as well as refer you for other special needs.  · Make a list of emergency phone numbers and have them available.  · Plan on getting help from family or friends when you go home from the hospital.  · Make a trial run to the hospital.  · Take prenatal classes with the father to understand, practice and ask questions about the labor and delivery.  · Prepare the baby's room/nursery.  · Do not travel out of the city unless it is absolutely necessary and with the advice of your caregiver.  · Wear only low or no heal shoes to have better balance and prevent falling.  MEDICATIONS AND DRUG USE IN PREGNANCY  · Take prenatal vitamins as directed. The vitamin should contain 1 milligram of folic acid. Keep all vitamins out of reach of children. Only a couple vitamins or tablets containing iron may be fatal to a baby or young child when ingested.  · Avoid use  of all medications, including herbs, over-the-counter medications, not prescribed or suggested by your caregiver. Only take over-the-counter or prescription medicines for pain, discomfort, or fever as directed by your caregiver. Do not use aspirin, ibuprofen (Motrin®, Advil®, Nuprin®) or naproxen (Aleve®) unless OK'd by your caregiver.  · Let your caregiver also know about herbs you may be using.  · Alcohol is related to a number of birth defects. This includes fetal alcohol syndrome. All alcohol, in any form, should be avoided completely. Smoking will cause low birth rate and premature babies.  · Street/illegal drugs are very harmful to the baby. They are absolutely forbidden. A baby born to an addicted mother will be addicted at birth. The baby will go through the same withdrawal an adult does.  SEEK MEDICAL CARE IF:  You have any concerns or worries during your pregnancy. It is better to call with your questions if you feel they cannot wait, rather than worry about them.  DECISIONS ABOUT CIRCUMCISION  You may or may not know the sex of your baby. If you know your baby is a boy, it may be time to think about circumcision. Circumcision is the removal of the foreskin of the penis. This is the skin that covers the sensitive end of the penis. There is no proven medical need for this. Often this decision is made on what is popular at the time or based upon religious beliefs and social issues. You can discuss these issues with your caregiver or pediatrician.  SEEK IMMEDIATE MEDICAL CARE IF:   · An unexplained oral temperature above 102° F (38.9° C) develops, or as your caregiver suggests.  · You have leaking of fluid from the vagina (birth canal). If leaking membranes are suspected, take your temperature and tell your caregiver of this when you call.  · There is vaginal spotting, bleeding or passing clots. Tell your caregiver of the amount and how many pads are used.  · You develop a bad smelling vaginal discharge with  a change in the color from clear to white.  · You develop vomiting that lasts more than 24 hours.  · You develop chills or fever.  · You develop shortness of breath.  · You develop burning on urination.  · You loose more than 2 pounds of weight   or gain more than 2 pounds of weight or as suggested by your caregiver.  · You notice sudden swelling of your face, hands, and feet or legs.  · You develop belly (abdominal) pain. Round ligament discomfort is a common non-cancerous (benign) cause of abdominal pain in pregnancy. Your caregiver still must evaluate you.  · You develop a severe headache that does not go away.  · You develop visual problems, blurred or double vision.  · If you have not felt your baby move for more than 1 hour. If you think the baby is not moving as much as usual, eat something with sugar in it and lie down on your left side for an hour. The baby should move at least 4 to 5 times per hour. Call right away if your baby moves less than that.  · You fall, are in a car accident or any kind of trauma.  · There is mental or physical violence at home.  Document Released: 07/26/2001 Document Revised: 10/24/2011 Document Reviewed: 01/28/2009  [employer]® Patient Information ©2013 [employer].

## 2012-12-06 LAB — GC/CHLAMYDIA PROBE AMP
CT Probe RNA: NEGATIVE
GC Probe RNA: NEGATIVE

## 2012-12-12 ENCOUNTER — Other Ambulatory Visit: Payer: Self-pay | Admitting: Family Medicine

## 2012-12-12 ENCOUNTER — Ambulatory Visit (INDEPENDENT_AMBULATORY_CARE_PROVIDER_SITE_OTHER): Payer: Self-pay | Admitting: Family Medicine

## 2012-12-12 VITALS — BP 103/67 | Wt 153.0 lb

## 2012-12-12 DIAGNOSIS — O34219 Maternal care for unspecified type scar from previous cesarean delivery: Secondary | ICD-10-CM

## 2012-12-12 DIAGNOSIS — Z3483 Encounter for supervision of other normal pregnancy, third trimester: Secondary | ICD-10-CM

## 2012-12-12 LAB — CBC
HCT: 37.1 % (ref 36.0–46.0)
MCHC: 34.8 g/dL (ref 30.0–36.0)
Platelets: 199 10*3/uL (ref 150–400)
RDW: 14.4 % (ref 11.5–15.5)

## 2012-12-12 LAB — HIV ANTIBODY (ROUTINE TESTING W REFLEX): HIV: NONREACTIVE

## 2012-12-12 LAB — OB RESULTS CONSOLE GBS: GBS: NEGATIVE

## 2012-12-12 NOTE — Progress Notes (Signed)
Pulse 107 Needs GBS. Had gc/ch at last visit.

## 2012-12-12 NOTE — Patient Instructions (Addendum)
Trabajo de parto y parto normal (Normal Labor and Delivery) En primer lugar, su mdico debe estar seguro de que usted est en trabajo de parto. Algunos signos son:  Puede haber eliminado el "tapn mucoso" antes que comience el trabajo de parto. Se trata de una pequea cantidad de mucus con sangre.  Tiene contracciones uterinas regulares.  El tiempo entre las contracciones se acorta.  Las molestias y el dolor se hacen gradualmente ms intensos.  El dolor se ubica principalmente en la espalda.  Los dolores empeoran al caminar.  El cuello del tero (la apertura del tero se hace ms delgada, comienza a borrarse, y se abre (se dilata). Una vez que se encuentre en trabajo de parto y sea admitida en el hospital, el mdico har lo siguiente:  Un examen fsico completo.  Controlar sus signos vitales (presin arterial, pulso, temperatura y la frecuencia cardaca fetal).  Realizar un examen vaginal (usando un guante estril y lubricante para determinar:  La posicin (presentacin) del beb (ceflica [vertex] o nalgas primero).  El nivel (plano) de la cabeza del beb en el canal de parto.  El borramiento y dilatacin del cuello del tero.  Le rasurarn el vello pbico y le aplicarn una enema segn lo considere el mdico y las circunstancias.  Generalmente se coloca un monitor electrnico sobre el abdomen. El monitor sigue la duracin e intensidad de las contracciones, as como la frecuencia cardaca del beb.  Generalmente, el profesional inserta una va intravenosa en el brazo para administrarle agua azucarada. Esta es una medida de precaucin, de modo que puedan administrarle rpidamente medicamentos durante el trabajo de parto. EL TRABAJO DE PARTO Y PARTO NORMALES SE DIVIDEN EN 3 ETAPAS: Primera etapa Comienzan las contracciones regulares y el cuello comienza a borrarse y dilatarse. Esta etapa puede durar entre 3 y 15 horas. El final de la primera etapa se considera cuando el cuello  est borrado en un 100% y se ha dilatado 10 cm. Le administrarn analgsicos por:  Inyeccin (morfina, demerol, etc.).  Anestesia regional (espinal, caudal o epidural, anestsicos colocados en diferentes regiones de la columna vertebral). Podrn administrarle medicamentos para el dolor en la regin paracervical, que consiste en la aplicacin de un anestsico inyectable en cada uno de los lados del cuello del tero. La embarazada puede requerir un "parto natural" , es decir no recibir medicamentos o anestesia durante el trabajo de parto y el parto. Segunda etapa En este momento el beb baja a travs del canal de parto (vagina) y nace. Esto puede durar entre 1 y 4 horas. A medida que el beb asoma la cabeza por el canal de parto, podr sentir una sensacin similar a cuando mueve el intestino. Sentir el impulse de empujar con fuerza hasta que el nio salga. A medida que la cabecita baja, el mdico decidir si realiza una episiotoma (corte en el perineo y rea de la vagina) para evitar la ruptura de los tejidos). Luego del nacimiento del beb y la expulsin de la placenta, la episiotoma se sutura. En algunos casos se coloca a la madre una mscara con xido nitroso para facilitar la respiracin y aliviar el dolor. El final de la etapa 2 se produce cuando el beb ha salido completamente. Luego, cuando el cordn umbilical deja de pulsar, se pinza y se corta. Tercera etapa La tercera etapa comienza luego que el beb ha nacido y finaliza luego de la expulsin de la placenta. Generalmente esto lleva entre 5 y 30 minutos. Luego de la expulsin de la   placenta, le aplicarn un medicamento por va intravenosa para ayudar a contraer el tero y prevenir hemorragias. En la tercera etapa no hay dolor y generalmente no son necesarios los analgsicos. Si le han realizado una episiotoma, es el momento de repararla. Luego del parto, la mam es observada y controlada exhaustivamente durante 1  2 horas para verificar que no  hay sangrado en el post parto (hemorragias). Si pierde mucha sangre, le administrarn un medicamento para contraer el tero y detener la hemorragia. Document Released: 07/14/2008 Document Revised: 10/24/2011 ExitCare Patient Information 2013 [employer].  

## 2012-12-12 NOTE — Progress Notes (Signed)
Occasional ctx. No other complaints.

## 2012-12-16 ENCOUNTER — Encounter: Payer: Self-pay | Admitting: Family Medicine

## 2012-12-19 ENCOUNTER — Ambulatory Visit (INDEPENDENT_AMBULATORY_CARE_PROVIDER_SITE_OTHER): Payer: Self-pay | Admitting: Obstetrics and Gynecology

## 2012-12-19 ENCOUNTER — Other Ambulatory Visit: Payer: Self-pay | Admitting: Obstetrics and Gynecology

## 2012-12-19 VITALS — BP 106/73 | Temp 97.1°F | Wt 155.4 lb

## 2012-12-19 DIAGNOSIS — O479 False labor, unspecified: Secondary | ICD-10-CM

## 2012-12-19 LAB — POCT URINALYSIS DIP (DEVICE)
Ketones, ur: NEGATIVE mg/dL
Protein, ur: NEGATIVE mg/dL
Specific Gravity, Urine: 1.02 (ref 1.005–1.030)
pH: 7 (ref 5.0–8.0)

## 2012-12-19 NOTE — Progress Notes (Signed)
UCs, painful at times, x 2 days. Fetus active. Mucusy discharge, no blood. Abd soft, NT, no palpable UC while here.

## 2012-12-19 NOTE — Patient Instructions (Signed)
Contracciones de Designer, multimedia ([employer]) Usted presenta un falso trabajo de [place]. Durante todo el embarazo aparecen con frecuencia contracciones del tero. Hacia el final del embarazo (32-34 semanas) estas contracciones (Braxton Hicks) pueden hacerse ms fuertes. No se trata de un trabajo de parto verdadero porque no producen un agrandamiento (dilatacin) y afinamiento del cuello del tero. Algunas veces resulta difcil distinguirlas del trabajo de parto verdadero porque en algunos casos llegan a ser muy intensas y las personas tienen distinta tolerancia al [employer]. No debe sentirse avergonzada si ingresa al hospital con un falso trabajo de [place]. En ocasiones la nica forma de saber si est en un parto verdadero es observar los cambios en el cuello del tero. A veces, la nica forma de saber si realmente est en trabajo de parto es para el mdico observar los cambios en el tero. Como diferenciar el [place] de parto falso del verdadero:  Aggie Cosier de parto falso.  Las contracciones falsas generalmente duran menos y no son tan intensas como las verdaderas.  Generalmente se sienten en la zona inferior del abdomen y en la ingle.  Pueden aliviarse con una caminata o cambiar de posicin mientras se est acostada.  A medida que pasa el tiempo son ms cortas y dbiles.  Generalmente son irregulares.  No se hacen progresivamente ms intensas y Herbalist entre s [employer].  Trabajo de parto verdadero.  Las contracciones verdaderas duran de 30 a 70 segundos, son ms regulares, generalmente se hacen ms intensas y Comptroller.  No desaparecen al caminar.  La molestia generalmente se siente en la parte superior del tero y se extiende hacia la zona inferior del abdomen y [employer] cintura.  El profesional que la asiste podr examinarla para determinar si el trabajo de parto es verdadero. El examen mostrar si el cuello del tero se est dilatando y [place]. Si  no hay problemas prenatales u otras complicaciones de la salud asociadas al embarazo, no habr inconvenientes si la envan a su casa y espera el comienzo del verdadero trabajo de [place]. INSTRUCCIONES PARA EL CUIDADO DOMICILIARIO  Siga con los ejercicios y las indicaciones habituales.  Tome los medicamentos como se le indic.  Cumpla con las citas regularmente.  Coma y beba ligero si cree que dar a luz.  Si se siente incmoda por las contracciones:  Cambie de [employer], si est acostada o en reposo, camine y si est caminando, repose.  Sintense y repose en una baadera con agua caliente.  Beba entre 2 y 3 vasos de [place]. La deshidratacin puede causar contracciones BH.  Respire lenta y profundamente varias veces por hora. SOLICITE ATENCIN MDICA DE INMEDIATO SI:  Las contracciones se intensifican, se hacen ms regulares y [employer] s.  Tiene una prdida importante de lquido de la vagina  La temperatura oral se eleva sin motivo por encima de 102 F (38.9 C) o segn le indique el profesional que la asiste.  Elimina una mucosidad sanguinolenta.  Presenta hemorragia vaginal.  Presenta dolor abdominal constante.  Siente un dolor en la parte baja de la espalda que nunca haba sentido antes.  Siente que el beb empuja hacia abajo y le causa presin plvica.  El beb no se mueve tanto como antes. Document Released: 05/11/2005 Document Revised: 10/24/2011 [facility] Patient Information 2013 [place], [place].

## 2012-12-19 NOTE — Progress Notes (Signed)
Pulse-  101 Pt feels she lost mucous plug

## 2012-12-26 ENCOUNTER — Ambulatory Visit (INDEPENDENT_AMBULATORY_CARE_PROVIDER_SITE_OTHER): Payer: Medicaid Other | Admitting: Advanced Practice Midwife

## 2012-12-26 ENCOUNTER — Encounter (HOSPITAL_COMMUNITY): Payer: Self-pay | Admitting: *Deleted

## 2012-12-26 ENCOUNTER — Inpatient Hospital Stay (HOSPITAL_COMMUNITY)
Admission: AD | Admit: 2012-12-26 | Discharge: 2012-12-28 | DRG: 775 | Disposition: A | Discharge status: Home / Self Care | Payer: Medicaid Other | Source: Ambulatory Visit | Attending: Obstetrics & Gynecology | Admitting: Obstetrics & Gynecology

## 2012-12-26 VITALS — BP 103/70 | Temp 97.0°F | Wt 155.3 lb

## 2012-12-26 DIAGNOSIS — Z3483 Encounter for supervision of other normal pregnancy, third trimester: Secondary | ICD-10-CM

## 2012-12-26 DIAGNOSIS — O34219 Maternal care for unspecified type scar from previous cesarean delivery: Secondary | ICD-10-CM

## 2012-12-26 LAB — POCT URINALYSIS DIP (DEVICE)
Protein, ur: NEGATIVE mg/dL
Specific Gravity, Urine: 1.01 (ref 1.005–1.030)
Urobilinogen, UA: 0.2 mg/dL (ref 0.0–1.0)

## 2012-12-26 NOTE — Progress Notes (Signed)
Pulse- 99 Patient reports contractions since yesterday morning that are painful as well as bloody tinged mucous d/c

## 2012-12-26 NOTE — Progress Notes (Signed)
Pt repots contractions since yesterday. Yesterday they were every hour, and today they have been about every 5 mins. Pt is planning on trying to have a VBAC, but states that if she has a long labor she may decide to have a c-section.

## 2012-12-26 NOTE — Progress Notes (Signed)
Artelia Laroche CNM notified of pt's arrival, VE, FHR, contraction pattern,orders received.

## 2012-12-27 ENCOUNTER — Inpatient Hospital Stay (HOSPITAL_COMMUNITY): Payer: Medicaid Other | Admitting: Anesthesiology

## 2012-12-27 ENCOUNTER — Encounter (HOSPITAL_COMMUNITY): Payer: Self-pay | Admitting: Anesthesiology

## 2012-12-27 ENCOUNTER — Encounter (HOSPITAL_COMMUNITY): Payer: Self-pay

## 2012-12-27 DIAGNOSIS — O34219 Maternal care for unspecified type scar from previous cesarean delivery: Secondary | ICD-10-CM

## 2012-12-27 LAB — TYPE AND SCREEN
ABO/RH(D): A POS
Antibody Screen: NEGATIVE

## 2012-12-27 LAB — CBC
HCT: 41.2 % (ref 36.0–46.0)
MCHC: 34.5 g/dL (ref 30.0–36.0)
MCV: 88.8 fL (ref 78.0–100.0)
Platelets: 171 10*3/uL (ref 150–400)
RDW: 14.2 % (ref 11.5–15.5)

## 2012-12-27 LAB — ABO/RH: ABO/RH(D): A POS

## 2012-12-27 MED ORDER — OXYTOCIN 40 UNITS IN LACTATED RINGERS INFUSION - SIMPLE MED
62.5000 mL/h | INTRAVENOUS | Status: DC
  Filled 2012-12-27: qty 1000

## 2012-12-27 MED ORDER — EPHEDRINE 5 MG/ML INJ
INTRAVENOUS | Status: AC
  Filled 2012-12-27: qty 4

## 2012-12-27 MED ORDER — FENTANYL 2.5 MCG/ML BUPIVACAINE 1/10 % EPIDURAL INFUSION (WH - ANES)
14.0000 mL/h | INTRAMUSCULAR | Status: DC | PRN
  Administered 2012-12-27: 14 mL/h via EPIDURAL

## 2012-12-27 MED ORDER — SIMETHICONE 80 MG PO CHEW: 80.0000 mg | CHEWABLE_TABLET | ORAL | Status: DC | PRN

## 2012-12-27 MED ORDER — BENZOCAINE-MENTHOL 20-0.5 % EX AERO: 1.0000 "application " | INHALATION_SPRAY | CUTANEOUS | Status: DC | PRN

## 2012-12-27 MED ORDER — FLEET ENEMA 7-19 GM/118ML RE ENEM: 1.0000 | ENEMA | RECTAL | Status: DC | PRN

## 2012-12-27 MED ORDER — LACTATED RINGERS IV SOLN: 500.0000 mL | INTRAVENOUS | Status: DC | PRN

## 2012-12-27 MED ORDER — DIBUCAINE 1 % RE OINT: 1.0000 "application " | TOPICAL_OINTMENT | RECTAL | Status: DC | PRN

## 2012-12-27 MED ORDER — OXYTOCIN BOLUS FROM INFUSION
500.0000 mL | INTRAVENOUS | Status: DC
  Administered 2012-12-27: 500 mL via INTRAVENOUS

## 2012-12-27 MED ORDER — ONDANSETRON HCL 4 MG PO TABS: 4.0000 mg | ORAL_TABLET | ORAL | Status: DC | PRN

## 2012-12-27 MED ORDER — CITRIC ACID-SODIUM CITRATE 334-500 MG/5ML PO SOLN: 30.0000 mL | ORAL | Status: DC | PRN

## 2012-12-27 MED ORDER — ONDANSETRON HCL 4 MG/2ML IJ SOLN: 4.0000 mg | Freq: Four times a day (QID) | INTRAMUSCULAR | Status: DC | PRN

## 2012-12-27 MED ORDER — PHENYLEPHRINE 40 MCG/ML (10ML) SYRINGE FOR IV PUSH (FOR BLOOD PRESSURE SUPPORT)
80.0000 ug | PREFILLED_SYRINGE | INTRAVENOUS | Status: DC | PRN
  Filled 2012-12-27: qty 2

## 2012-12-27 MED ORDER — ONDANSETRON HCL 4 MG/2ML IJ SOLN: 4.0000 mg | INTRAMUSCULAR | Status: DC | PRN

## 2012-12-27 MED ORDER — LIDOCAINE HCL (PF) 1 % IJ SOLN
INTRAMUSCULAR | Status: DC | PRN
  Administered 2012-12-27 (×4): 4 mL

## 2012-12-27 MED ORDER — OXYCODONE-ACETAMINOPHEN 5-325 MG PO TABS: 1.0000 | ORAL_TABLET | ORAL | Status: DC | PRN

## 2012-12-27 MED ORDER — IBUPROFEN 600 MG PO TABS
600.0000 mg | ORAL_TABLET | Freq: Four times a day (QID) | ORAL | Status: DC
  Administered 2012-12-27 – 2012-12-28 (×4): 600 mg via ORAL
  Filled 2012-12-27 (×5): qty 1

## 2012-12-27 MED ORDER — PHENYLEPHRINE 40 MCG/ML (10ML) SYRINGE FOR IV PUSH (FOR BLOOD PRESSURE SUPPORT)
PREFILLED_SYRINGE | INTRAVENOUS | Status: AC
  Filled 2012-12-27: qty 5

## 2012-12-27 MED ORDER — LACTATED RINGERS IV SOLN
500.0000 mL | Freq: Once | INTRAVENOUS | Status: AC
  Administered 2012-12-27: 500 mL via INTRAVENOUS

## 2012-12-27 MED ORDER — SENNOSIDES-DOCUSATE SODIUM 8.6-50 MG PO TABS
2.0000 | ORAL_TABLET | Freq: Every day | ORAL | Status: DC
  Administered 2012-12-27: 2 via ORAL

## 2012-12-27 MED ORDER — FENTANYL 2.5 MCG/ML BUPIVACAINE 1/10 % EPIDURAL INFUSION (WH - ANES)
INTRAMUSCULAR | Status: AC
  Filled 2012-12-27: qty 125

## 2012-12-27 MED ORDER — WITCH HAZEL-GLYCERIN EX PADS: 1.0000 "application " | MEDICATED_PAD | CUTANEOUS | Status: DC | PRN

## 2012-12-27 MED ORDER — DIPHENHYDRAMINE HCL 25 MG PO CAPS: 25.0000 mg | ORAL_CAPSULE | Freq: Four times a day (QID) | ORAL | Status: DC | PRN

## 2012-12-27 MED ORDER — LANOLIN HYDROUS EX OINT: TOPICAL_OINTMENT | CUTANEOUS | Status: DC | PRN

## 2012-12-27 MED ORDER — EPHEDRINE 5 MG/ML INJ
10.0000 mg | INTRAVENOUS | Status: DC | PRN
  Filled 2012-12-27: qty 2

## 2012-12-27 MED ORDER — TETANUS-DIPHTH-ACELL PERTUSSIS 5-2.5-18.5 LF-MCG/0.5 IM SUSP: 0.5000 mL | Freq: Once | INTRAMUSCULAR | Status: DC

## 2012-12-27 MED ORDER — ZOLPIDEM TARTRATE 5 MG PO TABS: 5.0000 mg | ORAL_TABLET | Freq: Every evening | ORAL | Status: DC | PRN

## 2012-12-27 MED ORDER — LIDOCAINE HCL (PF) 1 % IJ SOLN
30.0000 mL | INTRAMUSCULAR | Status: DC | PRN
  Filled 2012-12-27 (×2): qty 30

## 2012-12-27 MED ORDER — PRENATAL MULTIVITAMIN CH
1.0000 | ORAL_TABLET | Freq: Every day | ORAL | Status: DC
  Administered 2012-12-27: 1 via ORAL
  Filled 2012-12-27 (×2): qty 1

## 2012-12-27 MED ORDER — IBUPROFEN 600 MG PO TABS: 600.0000 mg | ORAL_TABLET | Freq: Four times a day (QID) | ORAL | Status: DC | PRN

## 2012-12-27 MED ORDER — FENTANYL CITRATE 0.05 MG/ML IJ SOLN: 50.0000 ug | INTRAMUSCULAR | Status: DC | PRN

## 2012-12-27 MED ORDER — ACETAMINOPHEN 325 MG PO TABS: 650.0000 mg | ORAL_TABLET | ORAL | Status: DC | PRN

## 2012-12-27 MED ORDER — LACTATED RINGERS IV SOLN
INTRAVENOUS | Status: DC
  Administered 2012-12-27 (×2): via INTRAVENOUS

## 2012-12-27 MED ORDER — DIPHENHYDRAMINE HCL 50 MG/ML IJ SOLN: 12.5000 mg | INTRAMUSCULAR | Status: DC | PRN

## 2012-12-27 NOTE — H&P (Signed)
Attestation of Attending Supervision of Advanced Practitioner (CNM/NP): Evaluation and management procedures were performed by the Advanced Practitioner under my supervision and collaboration.  I have reviewed the Advanced Practitioner's note and chart, and I agree with the management and plan.  HARRAWAY-SMITH, Tarae Wooden 3:24 AM

## 2012-12-27 NOTE — H&P (Signed)
Alison Zuniga is a 22 y.o. female presenting for Labor. Maternal Medical History:  Reason for admission: Contractions.  Nausea.  Contractions: Onset was 3-5 hours ago.   Frequency: regular.   Perceived severity is moderate.    Fetal activity: Perceived fetal activity is normal.   Last perceived fetal movement was within the past hour.    Prenatal complications: No bleeding.   Prenatal Complications - Diabetes: none.    OB History   Grav Para Term Preterm Abortions TAB SAB Ect Mult Living   2 1 1  0 0  0   1     Past Medical History  Diagnosis Date  . No pertinent past medical history    Past Surgical History  Procedure Laterality Date  . Cesarean section     Family History: family history is not on file. Social History:  reports that she has never smoked. She has never used smokeless tobacco. She reports that she does not drink alcohol or use illicit drugs.   Prenatal Transfer Tool  Maternal Diabetes: No Genetic Screening: Normal Maternal Ultrasounds/Referrals: Normal Fetal Ultrasounds or other Referrals:  None Maternal Substance Abuse:  No Significant Maternal Medications:  None Significant Maternal Lab Results:  None Other Comments:  None  Review of Systems  Constitutional: Negative for fever, chills and malaise/fatigue.  Gastrointestinal: Positive for abdominal pain. Negative for nausea, vomiting, diarrhea and constipation.  Genitourinary: Negative for dysuria.  Neurological: Negative for dizziness and headaches.    Dilation: 6 Effacement (%): 90 Station: -2 Exam by:: l.poore ,rn Blood pressure 102/68, pulse 84, temperature 97.7 F (36.5 C), temperature source Oral, resp. rate 18, height 5\' 1"  (1.549 m), weight 157 lb (71.215 kg), SpO2 96.00%. Maternal Exam:  Uterine Assessment: Contraction strength is firm.  Contraction frequency is regular.   Abdomen: Surgical scars: low transverse.   Fundal height is 38.   Estimated fetal weight is 8.   Fetal  presentation: vertex  Introitus: Normal vulva. Normal vagina.  Vagina is negative for discharge.  Ferning test: not done.  Nitrazine test: not done. Amniotic fluid character: not assessed.  Pelvis: adequate for delivery.   Cervix: Cervix evaluated by digital exam.     Fetal Exam Fetal Monitor Review: Mode: ultrasound.   Baseline rate: 140.  Variability: moderate (6-25 bpm).   Pattern: accelerations present and no decelerations.    Fetal State Assessment: Category I - tracings are normal.     Physical Exam  Constitutional: She is oriented to person, place, and time. She appears well-developed and well-nourished. No distress.  HENT:  Head: Normocephalic.  Cardiovascular: Normal rate.   Respiratory: Effort normal.  GI: Soft. She exhibits no distension and no mass. There is no tenderness. There is no rebound and no guarding.  Genitourinary: Vagina normal and uterus normal. No vaginal discharge found.  Musculoskeletal: Normal range of motion.  Neurological: She is alert and oriented to person, place, and time.  Skin: Skin is warm and dry.  Psychiatric: She has a normal mood and affect.   Dilation: 6 Effacement (%): 90 Cervical Position: Posterior Station: -2 Presentation: Vertex Exam by:: l.poore ,rn  Prenatal labs: ABO, Rh: A/POS/-- (10/02 0931) Antibody: NEG (10/02 0931) Rubella: 63.7 (10/02 0931) RPR: NON REAC (04/30 0902)  HBsAg: NEGATIVE (10/02 0931)  HIV: NON REACTIVE (04/30 0902)  GBS: Negative (04/30 0000)   Assessment/Plan: A:  SIUP at [redacted]w[redacted]d       Active Labor P:  Admit to [employer]      Routine orders  Epidural  Alison Zuniga 12/27/2012, 2:40 AM

## 2012-12-27 NOTE — Anesthesia Procedure Notes (Signed)
Epidural Patient location during procedure: OB Start time: 12/27/2012 1:33 AM  Staffing Performed by: anesthesiologist   Preanesthetic Checklist Completed: patient identified, site marked, surgical consent, pre-op evaluation, timeout performed, IV checked, risks and benefits discussed and monitors and equipment checked  Epidural Patient position: sitting Prep: site prepped and draped and DuraPrep Patient monitoring: continuous pulse ox and blood pressure Approach: midline Injection technique: LOR air  Needle:  Needle type: Tuohy  Needle gauge: 17 G Needle length: 9 cm and 9 Needle insertion depth: 5 cm cm Catheter type: closed end flexible Catheter size: 19 Gauge Catheter at skin depth: 10 cm Test dose: negative  Assessment Events: blood not aspirated, injection not painful, no injection resistance, negative IV test and no paresthesia  Additional Notes Discussed risk of headache, infection, bleeding, nerve injury and failed or incomplete block.  Patient voices understanding and wishes to proceed.  Epidural placed easily on first attempt.  No paresthesia.  Patient tolerated procedure well with no apparent complications.  Jasmine December, MD Reason for block:procedure for pain

## 2012-12-27 NOTE — Lactation Note (Signed)
This note was copied from the chart of Alison Banita Lehn. Lactation Consultation Note Initial consultation with experienced mom, mom states she attempted to breast feed her first child, but he was in the hospital for several weeks and she was unable to maintain lactation. CN RN reports that mom is already asking for formula (baby now 6 hours old).  When I enter room, mom has baby to breast. CN RN had assisted mom to get baby positioned and latched. Baby is feeding well with deep latch and rhythmic sucking. Both mom and baby comfortable. Discussed br feeding basics with mom and reviewed Baby and Me book. Discussed the risks of giving formula this early, and enc mom to avoid using formula at this time. Mom states she understands how formula affects her milk supply.  Spanish Lactation brochure provided for mom. Mom made aware of lactation services and [facility]. Mom states she does not have any questions at this time. Enc mom to call for assistance if she has any concerns.  Patient Name: Alison Zuniga [id number] Date: 12/27/2012 Reason for consult: Initial assessment   Maternal Data Formula Feeding for Exclusion: Yes Reason for exclusion: Mother's choice to formula and breast feed on admission Does the patient have breastfeeding experience prior to this delivery?: Yes  Feeding Feeding Type: Breast Milk Feeding method: Breast  LATCH Score/Interventions Latch: Grasps breast easily, tongue down, lips flanged, rhythmical sucking.  Audible Swallowing: A few with stimulation  Type of Nipple: Everted at rest and after stimulation  Comfort (Breast/Nipple): Soft / non-tender     Hold (Positioning): Assistance needed to correctly position infant at breast and maintain latch. Intervention(s): Breastfeeding basics reviewed;Support Pillows;Position options;Skin to skin  LATCH Score: 8  Lactation Tools Discussed/Used     Consult Status Consult Status: Follow-up Follow-up type:  In-patient    [facility] 12/27/2012, 1:27 PM

## 2012-12-27 NOTE — Anesthesia Preprocedure Evaluation (Signed)
Anesthesia Evaluation  Patient identified by MRN, date of birth, ID band Patient awake    Reviewed: Allergy & Precautions, H&P , NPO status , Patient's Chart, lab work & pertinent test results, reviewed documented beta blocker date and time   History of Anesthesia Complications Negative for: history of anesthetic complications  Airway Mallampati: III TM Distance: >3 FB Neck ROM: full    Dental  (+) Teeth Intact   Pulmonary neg pulmonary ROS,  breath sounds clear to auscultation        Cardiovascular negative cardio ROS  Rhythm:regular Rate:Normal     Neuro/Psych negative neurological ROS  negative psych ROS   GI/Hepatic negative GI ROS, Neg liver ROS,   Endo/Other  negative endocrine ROS  Renal/GU negative Renal ROS     Musculoskeletal   Abdominal   Peds  Hematology negative hematology ROS (+)   Anesthesia Other Findings   Reproductive/Obstetrics (+) Pregnancy (h/o c/s x1 for FTP, attempting TOLAC)                           Anesthesia Physical Anesthesia Plan  ASA: II  Anesthesia Plan: Epidural   Post-op Pain Management:    Induction:   Airway Management Planned:   Additional Equipment:   Intra-op Plan:   Post-operative Plan:   Informed Consent: I have reviewed the patients History and Physical, chart, labs and discussed the procedure including the risks, benefits and alternatives for the proposed anesthesia with the patient or authorized representative who has indicated his/her understanding and acceptance.     Plan Discussed with:   Anesthesia Plan Comments:         Anesthesia Quick Evaluation

## 2012-12-27 NOTE — Anesthesia Postprocedure Evaluation (Signed)
Anesthesia Post Note  Patient: Alison Zuniga  Procedure(s) Performed: * No procedures listed *  Anesthesia type: Epidural  Patient location: Mother/Baby  Post pain: Pain level controlled  Post assessment: Post-op Vital signs reviewed  Last Vitals:  Filed Vitals:   12/27/12 1507  BP: 106/57  Pulse: 72  Temp: 36.9 C  Resp: 18    Post vital signs: Reviewed  Level of consciousness:alert  Complications: No apparent anesthesia complications

## 2012-12-27 NOTE — Progress Notes (Signed)
Alison Zuniga is a 22 y.o. G2P1001 at [redacted]w[redacted]d by ultrasound admitted for active labor  Subjective: Doing well  Objective: BP 95/67  Pulse 80  Temp(Src) 97.7 F (36.5 C) (Oral)  Resp 18  Ht 5\' 1"  (1.549 m)  Wt 157 lb (71.215 kg)  BMI 29.68 kg/m2  SpO2 96%      FHT:  FHR: 120 bpm, variability: moderate,  accelerations:  Present,  decelerations:  Absent UC:   irregular, every 3-5 minutes SVE:   Dilation: 6 Effacement (%): 90 Station: -2 Exam by:: l.poore ,rn  Labs: Lab Results  Component Value Date   WBC 8.6 12/27/2012   HGB 14.2 12/27/2012   HCT 41.2 12/27/2012   MCV 88.8 12/27/2012   PLT 171 12/27/2012    Assessment / Plan: Spontaneous labor, progressing normally  Labor: Progressing normally Preeclampsia:  n/a Fetal Wellbeing:  Category I Pain Control:  Epidural I/D:  n/a Anticipated MOD:  NSVD  WILLIAMS,MARIE 12/27/2012, 4:01 AM

## 2012-12-28 NOTE — Discharge Summary (Signed)
Obstetric Discharge Summary Reason for Admission: onset of labor Prenatal Procedures: ultrasound Intrapartum Procedures: VBAC Postpartum Procedures: none Complications-Operative and Postpartum: none Hemoglobin  Date Value Range Status  12/27/2012 14.2  12.0 - 15.0 g/dL Final     HCT  Date Value Range Status  12/27/2012 41.2  36.0 - 46.0 % Final    Physical Exam:  General: alert, cooperative and no distress Lochia: appropriate Uterine Fundus: firm Incision: n/Zuniga DVT Evaluation: No evidence of DVT seen on physical exam.  Discharge Diagnoses: Term Pregnancy-delivered  Discharge Information: Date: 12/28/2012 Activity: pelvic rest Diet: routine Medications: None Condition: stable Instructions: refer to practice specific booklet Discharge to: home Follow-up Information   Follow up with [facility] [place]. Schedule an appointment as soon as possible for Zuniga visit in 6 weeks.   Contact information:   [address] [place] [address] [phone]      Newborn Data: Live born female  Birth Weight: 7 lb 9 oz (3430 g) APGAR: 8, 9  Home with mother.  Alison Zuniga, Alison Zuniga 12/28/2012, 8:04 AM  I have seen and examined this patient and agree the above assessment. Alison Zuniga,Alison Zuniga 12/28/2012 8:08 AM

## 2012-12-31 NOTE — Discharge Summary (Signed)
Attestation of Attending Supervision of Advanced Practitioner (CNM/NP): Evaluation and management procedures were performed by the Advanced Practitioner under my supervision and collaboration.  I have reviewed the Advanced Practitioner's note and chart, and I agree with the management and plan.  Dewana Ammirati 12/31/2012 9:20 AM   

## 2013-01-01 ENCOUNTER — Encounter: Payer: Self-pay | Admitting: Obstetrics and Gynecology

## 2013-01-31 ENCOUNTER — Ambulatory Visit (INDEPENDENT_AMBULATORY_CARE_PROVIDER_SITE_OTHER): Payer: Medicaid Other | Admitting: Obstetrics and Gynecology

## 2013-01-31 VITALS — BP 101/60 | HR 69 | Temp 99.1°F | Ht 61.0 in | Wt 127.0 lb

## 2013-01-31 DIAGNOSIS — Z309 Encounter for contraceptive management, unspecified: Secondary | ICD-10-CM

## 2013-01-31 DIAGNOSIS — IMO0001 Reserved for inherently not codable concepts without codable children: Secondary | ICD-10-CM

## 2013-01-31 DIAGNOSIS — O34219 Maternal care for unspecified type scar from previous cesarean delivery: Secondary | ICD-10-CM | POA: Insufficient documentation

## 2013-01-31 DIAGNOSIS — B3789 Other sites of candidiasis: Secondary | ICD-10-CM

## 2013-01-31 MED ORDER — NORETHINDRONE 0.35 MG PO TABS: 1.0000 | ORAL_TABLET | Freq: Every day | ORAL | Status: DC

## 2013-01-31 MED ORDER — CLOTRIMAZOLE-BETAMETHASONE 1-0.05 % EX CREA: TOPICAL_CREAM | Freq: Two times a day (BID) | CUTANEOUS | Status: DC

## 2013-01-31 NOTE — Patient Instructions (Signed)
Alison Zuniga y amamantamiento (Candida and Breastfeeding) La Alison Zuniga tambin es llamada la levadura, monilla o aftas. Es un hongo que se encuentra naturalmente en el organismo. La Alison Zuniga vive en lugares clidos,oscuros y Alison Zuniga mucosas de la boca, la vagina, el rea del paal, los pliegues de la piel, las almohadillas del sujetador y en los pezones hmedos. El uso de antibiticos ayuda a que el hongo crezca porque los antibiticos matan las bacterias buenas de nuestro cuerpo. Son ms frecuentes en mujeres embarazadas y personas con diabetes. Las infecciones por levaduras tambin pueden conducir a la obstruccin de los conductos (canales para el flujo de [place]) e inflamacin de la mama (mastitis) e incluso puede formar un absceso. Debido a que Alison Zuniga, advertising crece en reas clidas y hmedas, se puede pasar entre Alison Zuniga y su beb. Tanto la madre como el beb pueden necesitar tratamiento al mismo tiempo con el fin de eliminar la infeccin. Es importante hacer esto incluso si uno de los Alison Zuniga no tiene Alison Zuniga. En ocasiones, los miembros de la familia (especialmente su pareja sexual) pueden necesitar ser tratado a la vez.  Alison Zuniga  Picazn o ardor grave, en la superficie de los pezones o en el interior del seno.  El dolor Alison Zuniga, entre o sobre todo despus de Alison Zuniga.  A veces se extiende (irradia) un dolor agudo y punzante desde el pezn en el pecho o en la espalda o el brazo.  Los pezones son sensibles al tacto y pueden doler, incluso con un roce. Los pezones tambin pueden estar:  Hinchados.  Con prdidas.  Con picazn.  Con ampollas.  Escamosos.  Inflamados (rojizos).  Raros.  Dolorosos. Si usted o su beb tienen alguno de los Alison Zuniga descritos anteriormente, sobre todo si ha estado tomando antibiticos, o si sus pezones duelen de repente despus de las dos primeras semanas del nacimiento, puede tener una infeccin por levaduras.  DIAGNSTICO  El diagnstico se  realiza en base a los Alison Zuniga.  Puede ser necesaria una evaluacin microscpica de la secrecin de las mamas o cultivos. TRATAMIENTO  Consulte con su mdico antes de Microbiologist. Es importante iniciar el tratamiento slo despus de asegurarse de que otros problemas no son la causa de la Alison Zuniga.  Aplique la crema antimictica que le indique el profesional.  Lave los pezones con agua tibia antes de amamantar al beb.  Se le puede aconsejar que deje de amamantar del pecho afectado y el uso de un extractor de Alison Zuniga.  Mantenga el pecho afectado vaco de leche con la Alison Zuniga o con un extractor de Alison Zuniga.  Si su beb tiene muguet o dermatitis del paal, realice un tratamiento con los medicamentos prescriptos por el mdico.  Si est amamantando y tiene Alison Zuniga, su beb deber recibir tratamiento para la candidiasis, incluso si no ve manchas blancas en la boca del beb. INSTRUCCIONES PARA EL CUIDADO DOMICILIARIO  Tome los medicamentos como se le indic. Si usted tiene una infeccin ductal por levaduras, se le podrn prescribir medicamentos por va oral. Asegrese de terminar todos los medicamentos. Asegrese de que su beb sea tratado al mismo tiempo y termine los medicamentos segn las indicaciones.  Lvese las manos con frecuencia. Esto se debe hacer antes y despus de Alison Zuniga, despus de ir al bao y antes o despus de cambiar el paal del beb. Use agua caliente y Alison Zuniga y toallas o paos suaves para Alison Zuniga.  Amamante con ms frecuencia, pero por tiempos ms cortos. Comience a amamantar en el lado menos  dolorido. Adormecer el pezn con hielo envuelto en un pao antes de amamantar puede ayudar.  En caso de que el amamantamiento se vuelva demasiado doloroso, podr sacarse la Alison Zuniga de forma temporal y Alison Zuniga a su beb con taza o mamadera hasta que el dolor disminuya.  Consuma una gran cantidad de yogur que tiene cultivos activos vivos y tome acidfilo de forma oral.  Slo tome  medicamentos de venta libre o los prescriptos para Alison Zuniga, las molestias o Publishing copy la fiebre segn las indicaciones de su mdico.  Use cremas o pomadas antibacterianas como le sugiri el profesional que la asiste.  Por lo general despus de 24-48 horas, deber sentir IT trainer. En algunos casos, los Alison Zuniga pueden empeorar antes de Alison Zuniga. Contine con el tratamiento por al menos 48 horas.  Seque al aire las mamas despus de Alison Zuniga.  Cambie de almohadillas del sujetador despus de cada amamantamiento.  Use sujetadores de algodn 100% y [employer] con agua caliente.  Lave el extractor de [place] y todas sus piezas a fondo en una solucin de Alison Zuniga y hirvalo en agua durante 5 minutos cada da.  Consulte con el profesional que lo asiste si no se siente mejor. El profesional que lo asiste puede prescribirle otros medicamentos orales. SOLICITE ATENCIN MDICA SI:  Usted o su beb no mejoran o empeoran con el tratamiento.  Alison Zuniga antibiticos y presenta dolores punzantes, Alison Zuniga, Alison Zuniga o ardor en los senos. SOLICITE ATENCIN MDICA DE INMEDIATO SI:  Usted tienen una temperatura oral de ms de 38,9 C (102 F) y no puede controlarla con medicamentos.  Presenta inflamacin y dolor intenso en el pecho.  Aparecen ampollas en los pechos.  Siente un bulto en el pecho, con o sin dolor.  Presenta hemorragia proveniente de los pezones. ASEGRESE QUE:   Comprende estas instrucciones.  Controlar su enfermedad.  Solicitar ayuda de inmediato si no mejora o empeora. Document Released: 05/10/2008 Document Revised: 10/24/2011 Alison Zuniga Patient Information 2014 [place], [place].

## 2013-01-31 NOTE — Progress Notes (Signed)
  Subjective:     Alison Zuniga is a 22 y.o. female G2P2002 who presents for a postpartum visit. She is 4 weeks postpartum following a spontaneous vaginal delivery. I have fully reviewed the prenatal and intrapartum course. The delivery was at 38.5 gestational weeks. Outcome: vaginal birth after cesarean (VBAC). Anesthesia: epidural. Postpartum course has been uncomplicated. Baby's course has been uncomplicated. Baby is feeding by breast. Bleeding no bleeding. Stopped 1 wk ago. Bowel function is normal. Bladder function is normal. Patient is not sexually active. Contraception method is abstinence. Postpartum depression screening: negative. Concerned that baby has oral thrush on tx and she has itching and sensitivity of left nipple-areolar region. The following portions of the patient's history were reviewed and updated as appropriate: allergies, current medications, past family history, past medical history, past social history, past surgical history and problem list.  Review of Systems Pertinent items are noted in HPI.   Objective:    BP 101/60  Pulse 69  Temp(Src) 99.1 F (37.3 C)  Ht 5\' 1"  (1.549 m)  Wt 127 lb (57.607 kg)  BMI 24.01 kg/m2  Breastfeeding? Yes  General:  alert, cooperative and no distress   Breasts:  positive findings: nipple dermatitis on the left lactational, no engorgement or inflammation  Lungs: clear to auscultation bilaterally  Heart:  regular rate and rhythm, S1, S2 normal, no murmur, click, rub or gallop  Abdomen: soft, NT, incision well-healed   Vulva:  normal  Vagina: not evaluated  Cervix:  no cervical motion tenderness  Corpus: normal size, contour, position, consistency, mobility, non-tender  Adnexa:  not evaluated  Rectal Exam: Not performed.        Assessment:     4.5 wks postpartum exam. Pap smear not done at today's visit.   Plan:    1. Contraception: oral progesterone-only contraceptive prescribed. Counseled LARC more effective and need to  take same time qd. 2. Pap due Oct 2014. Diet and exercise discussed 3. Follow up in: 4 months or as needed.

## 2013-03-08 ENCOUNTER — Encounter: Payer: Self-pay | Admitting: *Deleted

## 2013-06-20 ENCOUNTER — Other Ambulatory Visit: Payer: Self-pay

## 2013-09-09 ENCOUNTER — Ambulatory Visit: Payer: Medicaid Other | Admitting: Family Medicine

## 2013-10-07 ENCOUNTER — Ambulatory Visit (INDEPENDENT_AMBULATORY_CARE_PROVIDER_SITE_OTHER): Payer: Medicaid Other | Admitting: Family Medicine

## 2013-10-07 ENCOUNTER — Encounter: Payer: Self-pay | Admitting: Family Medicine

## 2013-10-07 VITALS — BP 92/68 | HR 67 | Temp 97.2°F | Ht 61.0 in | Wt 130.4 lb

## 2013-10-07 DIAGNOSIS — N644 Mastodynia: Secondary | ICD-10-CM

## 2013-10-07 NOTE — Progress Notes (Signed)
GYN OFFICE NOTE  Chief Complaint:  Breast marbles  Primary Care Physician: No primary provider on file.  HPI:  Alison RickerSaundy Zuniga is a 23 yo G2P2002 who presents with pain and white spots in her breasts.  - "marbles" are white spots on the nipples that were there 3 weeks ago but have now been gone for the last 2-3 weeks.   - soon after they disappeared she noticed pain on the lateral aspects of her breasts that lasted for about a week.  - pain is dull and bilateral - seems to be worse with palpation - currently has neither pain or changes to her nipples - never had this before.   - quit breast feeding 5 months ago   LMP 09/09/13  No fevers, chills, nausea, vomiting, skin changes, rashes, nipple discharge.     PMHx:  Past Medical History  Diagnosis Date  . No pertinent past medical history     Past Surgical History  Procedure Laterality Date  . Cesarean section      FAMHx:  History reviewed. No pertinent family history.  SOCHx:   reports that she has never smoked. She has never used smokeless tobacco. She reports that she does not drink alcohol or use illicit drugs.  ALLERGIES:  No Known Allergies  ROS: Pertinent ROS as seen in HPI. Otherwise negative.   HOME MEDS: No current outpatient prescriptions on file.   No current facility-administered medications for this visit.    LABS/IMAGING: No results found for this or any previous visit (from the past 48 hour(s)). No results found.  VITALS: BP 92/68  Pulse 67  Temp(Src) 97.2 F (36.2 C) (Oral)  Ht 5\' 1"  (1.549 m)  Wt 130 lb 6.4 oz (59.149 kg)  BMI 24.65 kg/m2  LMP 09/09/2013  Breastfeeding? No  EXAM: GENERAL: Well-developed, well-nourished female in no acute distress.  BREASTS: Symmetric in size. No masses, skin changes, nipple drainage, or lymphadenopathy.   ASSESSMENT: Breast tenderness in female  PLAN:  - normal breast exam - reassurance given - likely tenderness due to progesterone during  cycle as occurred 2 weeks post menstrual cycle - advised to let us know should it persist.    Shanica Castellanos L, MD

## 2014-04-16 ENCOUNTER — Encounter (HOSPITAL_COMMUNITY): Payer: Self-pay | Admitting: *Deleted

## 2014-04-16 ENCOUNTER — Inpatient Hospital Stay (HOSPITAL_COMMUNITY): Payer: Self-pay

## 2014-04-16 ENCOUNTER — Inpatient Hospital Stay (HOSPITAL_COMMUNITY)
Admission: AD | Admit: 2014-04-16 | Discharge: 2014-04-16 | Disposition: A | Discharge status: Home / Self Care | Payer: Self-pay | Source: Ambulatory Visit | Attending: Family Medicine | Admitting: Family Medicine

## 2014-04-16 DIAGNOSIS — A499 Bacterial infection, unspecified: Secondary | ICD-10-CM | POA: Insufficient documentation

## 2014-04-16 DIAGNOSIS — O2390 Unspecified genitourinary tract infection in pregnancy, unspecified trimester: Secondary | ICD-10-CM | POA: Insufficient documentation

## 2014-04-16 DIAGNOSIS — O209 Hemorrhage in early pregnancy, unspecified: Secondary | ICD-10-CM

## 2014-04-16 DIAGNOSIS — B9689 Other specified bacterial agents as the cause of diseases classified elsewhere: Secondary | ICD-10-CM

## 2014-04-16 DIAGNOSIS — B3731 Acute candidiasis of vulva and vagina: Secondary | ICD-10-CM

## 2014-04-16 DIAGNOSIS — O4690 Antepartum hemorrhage, unspecified, unspecified trimester: Secondary | ICD-10-CM

## 2014-04-16 DIAGNOSIS — N760 Acute vaginitis: Secondary | ICD-10-CM | POA: Insufficient documentation

## 2014-04-16 DIAGNOSIS — B373 Candidiasis of vulva and vagina: Secondary | ICD-10-CM

## 2014-04-16 LAB — HCG, QUANTITATIVE, PREGNANCY: HCG, BETA CHAIN, QUANT, S: 137 m[IU]/mL — AB (ref <5)

## 2014-04-16 LAB — CBC
HCT: 41.5 % (ref 36.0–46.0)
Hemoglobin: 14.1 g/dL (ref 12.0–15.0)
MCH: 29 pg (ref 26.0–34.0)
MCHC: 34 g/dL (ref 30.0–36.0)
MCV: 85.4 fL (ref 78.0–100.0)
PLATELETS: 269 10*3/uL (ref 150–400)
RBC: 4.86 MIL/uL (ref 3.87–5.11)
RDW: 12.9 % (ref 11.5–15.5)
WBC: 7.6 10*3/uL (ref 4.0–10.5)

## 2014-04-16 LAB — URINALYSIS, ROUTINE W REFLEX MICROSCOPIC
Bilirubin Urine: NEGATIVE
GLUCOSE, UA: NEGATIVE mg/dL
Ketones, ur: NEGATIVE mg/dL
NITRITE: NEGATIVE
PH: 6 (ref 5.0–8.0)
Protein, ur: NEGATIVE mg/dL
SPECIFIC GRAVITY, URINE: 1.02 (ref 1.005–1.030)
Urobilinogen, UA: 1 mg/dL (ref 0.0–1.0)

## 2014-04-16 LAB — WET PREP, GENITAL: Trich, Wet Prep: NONE SEEN

## 2014-04-16 LAB — URINE MICROSCOPIC-ADD ON

## 2014-04-16 LAB — POCT PREGNANCY, URINE: PREG TEST UR: POSITIVE — AB

## 2014-04-16 LAB — HIV ANTIBODY (ROUTINE TESTING W REFLEX): HIV: NONREACTIVE

## 2014-04-16 MED ORDER — FLUCONAZOLE 150 MG PO TABS
150.0000 mg | ORAL_TABLET | Freq: Once | ORAL | Status: AC
  Administered 2014-04-16: 150 mg via ORAL
  Filled 2014-04-16: qty 1

## 2014-04-16 MED ORDER — METRONIDAZOLE 500 MG PO TABS: 500.0000 mg | ORAL_TABLET | Freq: Two times a day (BID) | ORAL | Status: DC

## 2014-04-16 NOTE — MAU Note (Signed)
Pt stated she took HPt and it was positive (3 weeks ago). Bleeding started 2 days ago. Denies Pain or cramping.

## 2014-04-16 NOTE — MAU Provider Note (Signed)
History     CSN: [id number]  Arrival date and time: 04/16/14 1208   First Provider Initiated Contact with Patient 04/16/14 1406      Chief Complaint  Patient presents with  . Vaginal Bleeding   HPI Yamil Dougher y.o. [id number] @ [redacted]w[redacted]d presents to MAU for vaginal bleeding.  She had a positive HPT 3 weeks ago.  Yesterday, she noticed some light spotting.  Today, she say she has bright red blood and is lightly bleeding.  She denies pain, nausea, vomiting, dysuria, fever.   OB History   Grav Para Term Preterm Abortions TAB SAB Ect Mult Living   0 0  0   2      Past Medical History  Diagnosis Date  . No pertinent past medical history     Past Surgical History  Procedure Laterality Date  . Cesarean section      History reviewed. No pertinent family history.  History  Substance Use Topics  . Smoking status: Never Smoker   . Smokeless tobacco: Never Used  . Alcohol Use: No    Allergies: No Known Allergies  No prescriptions prior to admission    Review of Systems  Constitutional: Negative for fever, chills and diaphoresis.  HENT: Negative for sore throat.   Respiratory: Negative for cough and shortness of breath.   Cardiovascular: Negative for chest pain and palpitations.  Gastrointestinal: Negative for heartburn, nausea, vomiting and abdominal pain.  Genitourinary: Positive for frequency. Negative for dysuria.  Skin: Negative for itching and rash.  Neurological: Negative for weakness and headaches.  Psychiatric/Behavioral: Negative for depression, suicidal ideas and substance abuse.   Physical Exam   Blood pressure 114/74, pulse 93, temperature 98.7 F (37.1 C), resp. rate 18, height  (1.549 m), weight 61.054 kg (134 lb 9.6 oz), last menstrual period 02/27/2014, not currently breastfeeding.  Physical Exam  Constitutional: She is oriented to person, place, and time. She appears well-developed and well-nourished. No distress.  HENT:  Head:  Normocephalic and atraumatic.  Eyes: EOM are normal.  Neck: Normal range of motion.  Cardiovascular: Normal rate and regular rhythm.   Respiratory: Effort normal and breath sounds normal.  GI: Soft. Bowel sounds are normal. She exhibits no distension. There is no tenderness.  Genitourinary:  Mod amt of discharge/bleeding in the vagina is a creamy red color.  Cervix does have erythema surrounding os.     Neurological: She is alert and oriented to person, place, and time.  Skin: Skin is warm and dry.  Psychiatric: She has a normal mood and affect.   Results for orders placed during the hospital encounter of 04/16/14 (from the past 24 hour(s))  URINALYSIS, ROUTINE W REFLEX MICROSCOPIC     Status: Abnormal   Collection Time    04/16/14  1:30 PM      Result Value Ref Range   Color, Urine YELLOW  YELLOW   APPearance CLEAR  CLEAR   Specific Gravity, Urine 1.020  1.005 - 1.030   pH 6.0  5.0 - 8.0   Glucose, UA NEGATIVE  NEGATIVE mg/dL   Hgb urine dipstick LARGE (*) NEGATIVE   Bilirubin Urine NEGATIVE  NEGATIVE   Ketones, ur NEGATIVE  NEGATIVE mg/dL   Protein, ur NEGATIVE  NEGATIVE mg/dL   Urobilinogen, UA 1.0  0.0 - 1.0 mg/dL   Nitrite NEGATIVE  NEGATIVE   Leukocytes, UA TRACE (*) NEGATIVE  URINE MICROSCOPIC-ADD ON     Status: Abnormal   Collection Time  04/16/14  1:30 PM      Result Value Ref Range   Squamous Epithelial / LPF MANY (*) RARE   WBC, UA 3-6  <3 WBC/hpf   RBC / HPF 0-2  <3 RBC/hpf   Bacteria, UA MANY (*) RARE  POCT PREGNANCY, URINE     Status: Abnormal   Collection Time    04/16/14  1:39 PM      Result Value Ref Range   Preg Test, Ur POSITIVE (*) NEGATIVE  WET PREP, GENITAL     Status: Abnormal   Collection Time    04/16/14  2:20 PM      Result Value Ref Range   Yeast Wet Prep HPF POC FEW (*) NONE SEEN   Trich, Wet Prep NONE SEEN  NONE SEEN   Clue Cells Wet Prep HPF POC FEW (*) NONE SEEN   WBC, Wet Prep HPF POC MANY (*) NONE SEEN  CBC     Status: None    Collection Time    04/16/14  2:25 PM      Result Value Ref Range   WBC 7.6  4.0 - 10.5 K/uL   RBC 4.86  3.87 - 5.11 MIL/uL   Hemoglobin 14.1  12.0 - 15.0 g/dL   HCT 16.1  09.6 - 04.5 %   MCV 85.4  78.0 - 100.0 fL   MCH 29.0  26.0 - 34.0 pg   MCHC 34.0  30.0 - 36.0 g/dL   RDW 40.9  81.1 - 91.4 %   Platelets 269  150 - 400 K/uL  HCG, QUANTITATIVE, PREGNANCY     Status: Abnormal   Collection Time    04/16/14  2:25 PM      Result Value Ref Range   hCG, Beta Chain, Quant, S 137 (*) <5 mIU/mL   US Ob Comp Less 14 Wks  04/16/2014   CLINICAL DATA:  Bleeding for 2 days. Six weeks 6 days pregnant by last menstrual period. Beta HCG of 137.  EXAM: OBSTETRIC <14 WK [place] AND TRANSVAGINAL OB US  TECHNIQUE: Both transabdominal and transvaginal ultrasound examinations were performed for complete evaluation of the gestation as well as the maternal uterus, adnexal regions, and pelvic cul-de-sac. Transvaginal technique was performed to assess early pregnancy.  COMPARISON:  08/21/2012  FINDINGS: Intrauterine gestational sac: Absent. Endometrial thickness upper normal, 1.4 cm.  Yolk sac:  Absent  Embryo:  Absent  Maternal uterus/adnexae: No subchorionic hemorrhage identified. Right ovarian hypervascular lesion measures 1.4 cm on image 53. Left ovary normal in morphology. No significant free fluid.  IMPRESSION: 1. Lack of intrauterine gestational sac or fetal pole. Given beta HCG level, this is likely due to a early intrauterine pregnancy. Differential considerations include otherwise occult ectopic pregnancy and missed abortion. Short-term beta HCG and possibly ultrasound followup suggested. 2. Right ovarian hypervascular lesion, likely a corpus luteal cyst.   Electronically Signed   By: Jeronimo Greaves M.D.   On: 04/16/2014 16:14   US Ob Transvaginal  04/16/2014   CLINICAL DATA:  Bleeding for 2 days. Six weeks 6 days pregnant by last menstrual period. Beta HCG of 137.  EXAM: OBSTETRIC <14 WK [place] AND TRANSVAGINAL OB US   TECHNIQUE: Both transabdominal and transvaginal ultrasound examinations were performed for complete evaluation of the gestation as well as the maternal uterus, adnexal regions, and pelvic cul-de-sac. Transvaginal technique was performed to assess early pregnancy.  COMPARISON:  08/21/2012  FINDINGS: Intrauterine gestational sac: Absent. Endometrial thickness upper normal, 1.4 cm.  Yolk sac:  Absent  Embryo:  Absent  Maternal uterus/adnexae: No subchorionic hemorrhage identified. Right ovarian hypervascular lesion measures 1.4 cm on image 53. Left ovary normal in morphology. No significant free fluid.  IMPRESSION: 1. Lack of intrauterine gestational sac or fetal pole. Given beta HCG level, this is likely due to a early intrauterine pregnancy. Differential considerations include otherwise occult ectopic pregnancy and missed abortion. Short-term beta HCG and possibly ultrasound followup suggested. 2. Right ovarian hypervascular lesion, likely a corpus luteal cyst.   Electronically Signed   By: Jeronimo Greaves M.D.   On: 04/16/2014 16:14     MAU Course  Procedures none MDM Quant of 137 - U/S cannot rule out ectopic.  No gest sac.   Wet prep with yeast, BV likely  Assessment and Plan  A: Vaginal bleeding in pregnancy Yeast infection Bacterial Vaginosis  P: Discharge to home Return to MAU in 48 hours for repeat quant Ectopic precautions discussed Diflucan  x 1 dose here Flagyl  bid x 7 days.  No etoh/IC x 7 days.   PNV daily.  Obtain PNC asap.  Bertram Denver 04/16/2014, 2:07 PM

## 2014-04-16 NOTE — Discharge Instructions (Signed)
Vaginal Bleeding During Pregnancy, First Trimester A small amount of bleeding (spotting) from the vagina is common in early pregnancy. Sometimes the bleeding is normal and is not a problem, and sometimes it is a sign of something serious. Be sure to tell your doctor about any bleeding from your vagina right away. HOME CARE  Watch your condition for any changes.  Follow your doctor's instructions about how active you can be.  If you are on bed rest:  You may need to stay in bed and only get up to use the bathroom.  You may be allowed to do some activities.  If you need help, make plans for someone to help you.  Write down:  The number of pads you use each day.  How often you change pads.  How soaked (saturated) your pads are.  Do not use tampons.  Do not douche.  Do not have sex or orgasms until your doctor says it is okay.  If you pass any tissue from your vagina, save the tissue so you can show it to your doctor.  Only take medicines as told by your doctor.  Do not take aspirin because it can make you bleed.  Keep all follow-up visits as told by your doctor. GET HELP IF:   You bleed from your vagina.  You have cramps.  You have labor pains.  You have a fever that does not go away after you take medicine. GET HELP RIGHT AWAY IF:   You have very bad cramps in your back or belly (abdomen).  You pass large clots or tissue from your vagina.  You bleed more.  You feel light-headed or weak.  You pass out (faint).  You have chills.  You are leaking fluid or have a gush of fluid from your vagina.  You pass out while pooping (having a bowel movement). MAKE SURE YOU:  Understand these instructions.  Will watch your condition.  Will get help right away if you are not doing well or get worse. Document Released: 12/16/2013 Document Reviewed: 04/08/2013 [facility] Patient Information 2015 [place], [place]. This information is not intended to replace advice given  to you by your health care provider. Make sure you discuss any questions you have with your health care provider. Yeast Infection of the Skin Some yeast on the skin is normal, but sometimes it causes an infection. If you have a yeast infection, it shows up as white or light brown patches on brown skin. You can see it better in the summer on tan skin. It causes light-colored holes in your suntan. It can happen on any area of the body. This cannot be passed from person to person. HOME CARE  Scrub your skin daily with a dandruff shampoo. Your rash may take a couple weeks to get well.  Do not scratch or itch the rash. GET HELP RIGHT AWAY IF:   You get another infection from scratching. The skin may get warm, red, and may ooze fluid.  The infection does not seem to be getting better. MAKE SURE YOU:  Understand these instructions.  Will watch your condition.  Will get help right away if you are not doing well or get worse. Document Released: 07/14/2008 Document Revised: 10/24/2011 Document Reviewed: 07/14/2008 [facility] Patient Information 2015 [place], [place]. This information is not intended to replace advice given to you by your health care provider. Make sure you discuss any questions you have with your health care provider. Bacterial Vaginosis Bacterial vaginosis is a vaginal infection  that occurs when the normal balance of bacteria in the vagina is disrupted. It results from an overgrowth of certain bacteria. This is the most common vaginal infection in women of childbearing age. Treatment is important to prevent complications, especially in pregnant women, as it can cause a premature delivery. CAUSES  Bacterial vaginosis is caused by an increase in harmful bacteria that are normally present in smaller amounts in the vagina. Several different kinds of bacteria can cause bacterial vaginosis. However, the reason that the condition develops is not fully understood. RISK FACTORS Certain  activities or behaviors can put you at an increased risk of developing bacterial vaginosis, including:  Having a new sex partner or multiple sex partners.  Douching.  Using an intrauterine device (IUD) for contraception. Women do not get bacterial vaginosis from toilet seats, bedding, swimming pools, or contact with objects around them. SIGNS AND SYMPTOMS  Some women with bacterial vaginosis have no signs or symptoms. Common symptoms include:  Grey vaginal discharge.  A fishlike odor with discharge, especially after sexual intercourse.  Itching or burning of the vagina and vulva.  Burning or pain with urination. DIAGNOSIS  Your health care provider will take a medical history and examine the vagina for signs of bacterial vaginosis. A sample of vaginal fluid may be taken. Your health care provider will look at this sample under a microscope to check for bacteria and abnormal cells. A vaginal pH test may also be done.  TREATMENT  Bacterial vaginosis may be treated with antibiotic medicines. These may be given in the form of a pill or a vaginal cream. A second round of antibiotics may be prescribed if the condition comes back after treatment.  HOME CARE INSTRUCTIONS   Only take over-the-counter or prescription medicines as directed by your health care provider.  If antibiotic medicine was prescribed, take it as directed. Make sure you finish it even if you start to feel better.  Do not have sex until treatment is completed.  Tell all sexual partners that you have a vaginal infection. They should see their health care provider and be treated if they have problems, such as a mild rash or itching.  Practice safe sex by using condoms and only having one sex partner. SEEK MEDICAL CARE IF:   Your symptoms are not improving after 3 days of treatment.  You have increased discharge or pain.  You have a fever. MAKE SURE YOU:   Understand these instructions.  Will watch your  condition.  Will get help right away if you are not doing well or get worse. FOR MORE INFORMATION  Centers for Disease Control and Prevention, Division of STD Prevention: [employer]Apps.[employer]za American Sexual Health Association (ASHA): www.ashastd.org  Document Released: 08/01/2005 Document Revised: 05/22/2013 Document Reviewed: 03/13/2013 [facility] Patient Information 2015 [place], [place]. This information is not intended to replace advice given to you by your health care provider. Make sure you discuss any questions you have with your health care provider.

## 2014-04-17 LAB — GC/CHLAMYDIA PROBE AMP
CT Probe RNA: NEGATIVE
GC PROBE AMP APTIMA: NEGATIVE

## 2014-04-17 NOTE — MAU Provider Note (Signed)
Attestation of Attending Supervision of Obstetric Fellow: Evaluation and management procedures were performed by the Obstetric Fellow under my supervision and collaboration.  I have reviewed the Obstetric Fellow's note and chart, and I agree with the management and plan.  Jacob Stinson, DO Attending Physician Faculty Practice, [facility]  

## 2014-04-18 ENCOUNTER — Other Ambulatory Visit: Payer: Self-pay

## 2014-04-20 ENCOUNTER — Encounter (HOSPITAL_COMMUNITY): Payer: Self-pay

## 2014-04-20 ENCOUNTER — Inpatient Hospital Stay (HOSPITAL_COMMUNITY)
Admission: AD | Admit: 2014-04-20 | Discharge: 2014-04-20 | Disposition: A | Discharge status: Home / Self Care | Payer: Medicaid Other | Source: Ambulatory Visit | Attending: Obstetrics & Gynecology | Admitting: Obstetrics & Gynecology

## 2014-04-20 DIAGNOSIS — O039 Complete or unspecified spontaneous abortion without complication: Secondary | ICD-10-CM

## 2014-04-20 DIAGNOSIS — O034 Incomplete spontaneous abortion without complication: Secondary | ICD-10-CM

## 2014-04-20 DIAGNOSIS — O200 Threatened abortion: Secondary | ICD-10-CM | POA: Insufficient documentation

## 2014-04-20 LAB — CBC
HEMATOCRIT: 41 % (ref 36.0–46.0)
Hemoglobin: 13.9 g/dL (ref 12.0–15.0)
MCH: 29.3 pg (ref 26.0–34.0)
MCHC: 33.9 g/dL (ref 30.0–36.0)
MCV: 86.3 fL (ref 78.0–100.0)
PLATELETS: 247 10*3/uL (ref 150–400)
RBC: 4.75 MIL/uL (ref 3.87–5.11)
RDW: 12.8 % (ref 11.5–15.5)
WBC: 8.6 10*3/uL (ref 4.0–10.5)

## 2014-04-20 LAB — URINALYSIS, ROUTINE W REFLEX MICROSCOPIC
BILIRUBIN URINE: NEGATIVE
Glucose, UA: 100 mg/dL — AB
KETONES UR: 15 mg/dL — AB
NITRITE: POSITIVE — AB
PH: 6.5 (ref 5.0–8.0)
Protein, ur: 100 mg/dL — AB
SPECIFIC GRAVITY, URINE: 1.02 (ref 1.005–1.030)
UROBILINOGEN UA: 1 mg/dL (ref 0.0–1.0)

## 2014-04-20 LAB — URINE MICROSCOPIC-ADD ON

## 2014-04-20 LAB — HCG, QUANTITATIVE, PREGNANCY: hCG, Beta Chain, Quant, S: 16 m[IU]/mL — ABNORMAL HIGH (ref <5)

## 2014-04-20 NOTE — Discharge Instructions (Signed)
Aborto espontáneo  °(Miscarriage) °El aborto espontáneo es la pérdida de un bebé que no ha nacido (feto) antes de la semana 20 del embarazo. La mayor parte de estos abortos ocurre en los primeros 3 meses. En algunos casos ocurre antes de que la mujer sepa que está embarazada. También se denomina "aborto espontáneo" o "pérdida prematura del embarazo". El aborto espontáneo puede ser una experiencia que afecte emocionalmente a la persona. Converse con su médico si tiene dudas, cómo es el proceso de duelo, y sobre planes futuros de embarazo.  °CAUSAS  °· Algunos problemas cromosómicos pueden hacer imposible que el bebé se desarrolle normalmente. Los problemas con los genes o cromosomas del bebé son generalmente el resultado de errores que se producen, por casualidad, cuando el embrión se divide y crece. Estos problemas no se heredan de los padres. °· Infección en el cuello del útero.   °· Problemas hormonales.   °· Problemas en el cuello del útero, como tener un útero incompetente. Esto ocurre cuando los tejidos no son lo suficientemente fuertes como para contener el embarazo.   °· Problemas del útero, como un útero con forma anormal, los fibromas o anormalidades congénitas.   °· Ciertas enfermedades crónicas.   °· No fume, no beba alcohol, ni consuma drogas.   °· Traumatismos   °A veces, la causa es desconocida.  °SÍNTOMAS  °· Sangrado o manchado vaginal, con o sin cólicos o dolor. °· Dolor o cólicos en el abdomen o en la cintura. °· Eliminación de líquido, tejidos o coágulos grandes por la vagina. °DIAGNÓSTICO  °El médico le hará un examen físico. También le indicará una ecografía para confirmar el aborto. Es posible que se realicen análisis de sangre.  °TRATAMIENTO  °· En algunos casos el tratamiento no es necesario, si se eliminan naturalmente todos los tejidos embrionarios que se encontraban en el útero. Si el feto o la placenta quedan dentro del útero (aborto incompleto), pueden infectarse, los tejidos que quedan  pueden infectarse y deben retirarse. Generalmente se realiza un procedimiento de dilatación y curetaje (D y C). Durante el procedimiento de dilatación y curetaje, el cuello del útero se abre (dilata) y se retira cualquier resto de tejido fetal o placentario del útero. °· Si hay una infección, le recetarán antibióticos. Podrán recetarle otros medicamentos para reducir el tamaño del útero (contraerlo) si hay una mucho sangrado. °· Si su sangre es Rh negativa y su bebé es Rh positivo, usted necesitará la inyección de inmunoglobulina Rh. Esta inyección protegerá a los futuros bebés de tener problemas de compatibilidad Rh en futuros embarazos. °INSTRUCCIONES PARA EL CUIDADO EN EL HOGAR  °· El médico le indicará reposo en cama o le permitirá realizar actividades livianas. Vuelva a la actividad lentamente o según las indicaciones de su médico. °· Pídale a alguien que la ayude con las responsabilidades familiares y del hogar durante este tiempo.   °· Lleve un registro de la cantidad y la saturación de las toallas higiénicas que utiliza cada día. Anote esta información   °· No use tampones. No No se haga duchas vaginales ni tenga relaciones sexuales hasta que el médico la autorice.   °· Sólo tome medicamentos de venta libre o recetados para calmar el dolor o el malestar, según las indicaciones de su médico.   °· No tome aspirina. La aspirina puede ocasionar hemorragias.   °· Concurra puntualmente a las citas de control con el médico.   °· Si usted o su pareja tienen dificultades con el duelo, hable con su médico para buscar la ayuda psicológica que los ayude a enfrentar la pérdida   del embarazo. Permítase el tiempo suficiente de duelo antes de quedar embarazada nuevamente.   °SOLICITE ATENCIÓN MÉDICA DE INMEDIATO SI:  °· Siente calambres intensos o dolor en la espalda o en el abdomen. °· Tiene fiebre. °· Elimina grandes coágulos de sangre (del tamaño de una nuez o más) o tejidos por la vagina. Guarde lo que ha eliminado para  que su médico lo examine.   °· La hemorragia aumenta.   °· Observa una secreción vaginal espesa y con mal olor. °· Se siente mareada, débil, o se desmaya.   °· Siente escalofríos.   °ASEGÚRESE DE QUE:  °· Comprende estas instrucciones. °· Controlará su enfermedad. °· Solicitará ayuda de inmediato si no mejora o si empeora. °Document Released: 05/11/2005 Document Revised: 11/26/2012 °ExitCare® Patient Information ©2015 [employer]. This information is not intended to replace advice given to you by your health care provider. Make sure you discuss any questions you have with your health care provider. ° °

## 2014-04-20 NOTE — MAU Provider Note (Signed)
Attestation of Attending Supervision of Advanced Practitioner (CNM/NP): Evaluation and management procedures were performed by the Advanced Practitioner under my supervision and collaboration. I have reviewed the Advanced Practitioner's note and chart, and I agree with the management and plan.  Nikita Humble H. 4:29 PM   

## 2014-04-20 NOTE — MAU Provider Note (Signed)
History     CSN: [id number]  Arrival date and time: 04/20/14 1211   First Provider Initiated Contact with Patient 04/20/14 1335      Chief Complaint  Patient presents with  . Vaginal Bleeding   HPI  Ms. Alison Zuniga is a 23 y.o. female G3P2002 at [redacted]w[redacted]d who presents with vaginal bleeding. She was seen on 9/2 for vaginal bleeding following a positive home pregnancy test. Her Quant on 9/2 was 137; she was instructed to return to MAU in a week for a beta hcg however returned today because of increased bleeding. She currently denies pain.  OB History   Grav Para Term Preterm Abortions TAB SAB Ect Mult Living   0 0  0   2      Past Medical History  Diagnosis Date  . No pertinent past medical history     Past Surgical History  Procedure Laterality Date  . Cesarean section      No family history on file.  History  Substance Use Topics  . Smoking status: Never Smoker   . Smokeless tobacco: Never Used  . Alcohol Use: No    Allergies: No Known Allergies  Prescriptions prior to admission  Medication Sig Dispense Refill  . metroNIDAZOLE (FLAGYL) 500 MG tablet Take 1 tablet (500 mg total) by mouth 2 (two) times daily.  14 tablet  0   Results for orders placed during the hospital encounter of 04/20/14 (from the past 48 hour(s))  URINALYSIS, ROUTINE W REFLEX MICROSCOPIC     Status: Abnormal   Collection Time    04/20/14 12:15 PM      Result Value Ref Range   Color, Urine RED (*) YELLOW   Comment: BIOCHEMICALS MAY BE AFFECTED BY COLOR   APPearance CLOUDY (*) CLEAR   Specific Gravity, Urine 1.020  1.005 - 1.030   pH 6.5  5.0 - 8.0   Glucose, UA 100 (*) NEGATIVE mg/dL   Hgb urine dipstick LARGE (*) NEGATIVE   Bilirubin Urine NEGATIVE  NEGATIVE   Ketones, ur 15 (*) NEGATIVE mg/dL   Protein, ur 409 (*) NEGATIVE mg/dL   Urobilinogen, UA 1.0  0.0 - 1.0 mg/dL   Nitrite POSITIVE (*) NEGATIVE   Leukocytes, UA MODERATE (*) NEGATIVE  URINE MICROSCOPIC-ADD ON     Status:  Abnormal   Collection Time    04/20/14 12:15 PM      Result Value Ref Range   Squamous Epithelial / LPF FEW (*) RARE   WBC, UA 0-2  <3 WBC/hpf   RBC / HPF TOO NUMEROUS TO COUNT  <3 RBC/hpf   Bacteria, UA FEW (*) RARE  HCG, QUANTITATIVE, PREGNANCY     Status: Abnormal   Collection Time    04/20/14 12:30 PM      Result Value Ref Range   hCG, Beta Chain, Quant, S 16 (*) <5 mIU/mL   Comment:              GEST. AGE      CONC.  (mIU/mL)       <=1 WEEK        5 - 50         2 WEEKS       50 - 500         3 WEEKS       100 - 10,000         4 WEEKS     1,000 - 30,000  5 WEEKS     3,500 - 115,000       6-8 WEEKS     12,000 - 270,000        12 WEEKS     15,000 - 220,000                FEMALE AND NON-PREGNANT FEMALE:         LESS THAN 5 mIU/mL  CBC     Status: None   Collection Time    04/20/14 12:30 PM      Result Value Ref Range   WBC 8.6  4.0 - 10.5 K/uL   RBC 4.75  3.87 - 5.11 MIL/uL   Hemoglobin 13.9  12.0 - 15.0 g/dL   HCT 30.8  65.7 - 84.6 %   MCV 86.3  78.0 - 100.0 fL   MCH 29.3  26.0 - 34.0 pg   MCHC 33.9  30.0 - 36.0 g/dL   RDW 96.2  95.2 - 84.1 %   Platelets 247  150 - 400 K/uL    Review of Systems  Constitutional: Negative for fever and chills.  Gastrointestinal: Negative for nausea, vomiting and abdominal pain.  Genitourinary: Negative for dysuria, urgency, frequency and hematuria.       No vaginal discharge. + vaginal bleeding. No dysuria.    Physical Exam   Blood pressure 99/67, pulse 80, temperature 98.3 F (36.8 C), temperature source Oral, resp. rate 16, last menstrual period 02/27/2014, not currently breastfeeding.  Physical Exam  Constitutional: She is oriented to person, place, and time. She appears well-developed and well-nourished. No distress.  HENT:  Head: Normocephalic.  Eyes: Pupils are equal, round, and reactive to light.  Neck: Neck supple.  GI: Soft. She exhibits no distension. There is no tenderness. There is no rebound and no  guarding.  Genitourinary:  Bimanual exam: Cervix slightly open, no CMT. Small amount of blood noted on glove.  Uterus non tender, normal size Adnexa non tender, no masses bilaterally Chaperone present for exam.   Musculoskeletal: Normal range of motion.  Neurological: She is alert and oriented to person, place, and time.  Skin: Skin is warm. She is not diaphoretic.  Psychiatric: Her behavior is normal.    MAU Course  Procedures None  MDM  A positive blood type Urine culture pending  CBC Quant on 9/2: 137 Quant on 9/6: 16 Assessment and Plan   A:  1. Inevitable spontaneous abortion    P:  Discharge home in stable condition Follow up in the clinic in 1 week; referral sent  Bleeding precautions discussed Pelvic rest Return to MAU with increased pain Support given Spanish interpretor used to discussed findings and plan of care.   Iona Hansen Rasch, NP  04/20/2014, 4:16 PM

## 2014-04-20 NOTE — MAU Note (Signed)
Pt. was seen in MAU Wednesday 04/16/14 with spotting and was told she had an infection; she has been taking prescribed antibiotics since that date. This morning she noticed heavier bleeding and began passing clots.

## 2014-04-22 ENCOUNTER — Encounter: Payer: Self-pay | Admitting: Obstetrics & Gynecology

## 2014-05-02 ENCOUNTER — Encounter: Payer: Self-pay | Admitting: Obstetrics & Gynecology

## 2014-05-02 ENCOUNTER — Ambulatory Visit (INDEPENDENT_AMBULATORY_CARE_PROVIDER_SITE_OTHER): Payer: Medicaid Other | Admitting: Obstetrics & Gynecology

## 2014-05-02 VITALS — BP 102/64 | HR 68 | Temp 98.5°F | Wt 133.6 lb

## 2014-05-02 DIAGNOSIS — O039 Complete or unspecified spontaneous abortion without complication: Secondary | ICD-10-CM

## 2014-05-02 DIAGNOSIS — Z3009 Encounter for other general counseling and advice on contraception: Secondary | ICD-10-CM

## 2014-05-02 MED ORDER — NORGESTIM-ETH ESTRAD TRIPHASIC 0.18/0.215/0.25 MG-25 MCG PO TABS: 1.0000 | ORAL_TABLET | Freq: Every day | ORAL | Status: AC

## 2014-05-02 NOTE — Patient Instructions (Signed)
Informacin sobre los anticonceptivos orales (Oral Contraception Information) Los anticonceptivos orales (ACO) son medicamentos que se utilizan para evitar el embarazo. Su funcin es evitar que los ovarios liberen vulos. Las hormonas de los ACO tambin hacen que el moco cervical se haga ms espeso, lo que evita que el esperma ingrese al tero. Tambin hacen que la membrana que recubre internamente al tero se vuelva ms fina, lo que no permite que el huevo fertilizado se adhiera a la pared del tero. Los ACO son muy efectivos cuando se toman exactamente como se prescriben. Sin embargo, no previenen contra las enfermedades de transmisin sexual (ETS). La prctica del sexo seguro, como el uso de preservativos, junto con la pldora, ayudan a prevenir ese tipo de enfermedades.  Antes de tomar la pldora, usted debe hacerse un examen fsico y un test de Pap. El mdico podr indicarle anlisis de sangre, si es necesario. El mdico se asegurar de que usted sea una buena candidata para usar anticonceptivos orales. Converse con su mdico acerca de los posibles efectos secundarios de los ACO que podran recetarle. Cuando se inicia el uso de ACO, puede llevar 2 a 3 meses para que su organismo se adapte a los cambios en los niveles hormonales.  TIPOS DE ANTICONCEPTIVOS ORALES  Pldora combinada: esta pldora contiene las hormonas estrgeno y progestina (progesterona sinttica). La pldora combinada viene en envases para 21 das, 28 das o 91 das. Algunos tipos de pldoras combinadas deben tomarse de manera continua (pldoras para 365 das). En los envases para 21 das, usted no tomar las pldoras durante 7 das despus de la ltima pldora. En los envases para 28 das, la pldora se toma todos los das. Las ltimas 7 no contienen hormonas. Ciertos tipos de pldoras tienen ms de 21 pldoras que contienen hormonas. En los envases para 91 das, las primeras 84 pldoras contienen ambas hormonas y las ltimas 7 pldoras  no contienen hormonas o contienen slo estrgenos.  La minipldora: esta pldora contiene la hormona progesterona solamente. Es necesario tomarla todos los das de manera continua. Es importante que las tome a la misma hora todos los das. Viene en envases de 28 pldoras. Las 28 pldoras contienen la hormona.  VENTAJAS DE LOS ANTICONCEPTIVOS ORALES  Disminuye los sntomas premenstruales.   Se usa para tratar los clicos menstruales.   Regula el ciclo menstrual.   Disminuye el ciclo menstrual abundante.   Puede mejorar el acn, segn el tipo de pldora.   Trata hemorragias uterinas anormales.   Trata el sndrome ovrico poliqustico.   Trata la endometriosis.   Pueden usarse como anticonceptivo de emergencia.  FACTORES QUE PUEDEN HACER QUE LOS ANTICONCEPTIVOS ORALES SEAN MENOS EFECTIVOS Pueden ser menos efectivos si:   Olvid tomar la pldora todos los das a la misma hora.   Tiene una enfermedad estomacal o intestinal que disminuye la absorcin de la pldora.   Ingiere simultneamente los anticonceptivos orales junto con otros medicamentos que los hacen menos efectivos, como antibiticos, ciertos medicamentos para el VIH y algunos medicamentos para las convulsiones.   Usted toma anticonceptivos orales que han vencido.   Cuando se usa el envase de 21 das, se olvida de recomenzar el uso en el da 7.  RIESGOS ASOCIADOS AL USO DE ANTICONCEPTIVOS ORALES  Los anticonceptivos orales pueden en algunos casos causar efectos secundarios como:  Dolor de cabeza.  Nuseas.  Inflamacin mamaria.  Hemorragia vaginal o manchado irregular. Las pldoras combinadas tambin se asocian a un pequeo aumento en el riesgo de:  Cogulos   sanguneos.  Ataque cardaco.  Ictus. Document Released: 05/11/2005 Document Revised: 05/22/2013 Exit[employer] Patient Information 2015 [employer]. This information is not intended to replace advice given to you by your health care provider.  Make sure you discuss any questions you have with your health care provider.  

## 2014-05-02 NOTE — Progress Notes (Signed)
Patient ID: Jenica Costilow, female   DOB: [birth year], 23 y.o.   MRN: [id number]  Chief Complaint  Patient presents with  . Follow-up    HPI Eun Vermeer is a 23 y.o. female.  [id number] Patient's last menstrual period was 02/27/2014. Patient was diagnosed with miscarriage 10 days ago in MAU. HCG dropped to <20, no bleeding now. She requests OCP.  HPI  Past Medical History  Diagnosis Date  . No pertinent past medical history     Past Surgical History  Procedure Laterality Date  . Cesarean section      No family history on file.  Social History History  Substance Use Topics  . Smoking status: Never Smoker   . Smokeless tobacco: Never Used  . Alcohol Use: No    No Known Allergies  Current Outpatient Prescriptions  Medication Sig Dispense Refill  . Norgestimate-Ethinyl Estradiol Triphasic 0.18/0.215/0.25 MG-25 MCG tab Take 1 tablet by mouth daily.  1 Package  11   No current facility-administered medications for this visit.    Review of Systems Review of Systems  Constitutional: Negative.   Genitourinary: Negative for vaginal bleeding, vaginal discharge and menstrual problem.    Blood pressure 102/64, pulse 68, temperature 98.5 F (36.9 C), weight 133 lb 9.6 oz (60.601 kg), last menstrual period 02/27/2014, unknown if currently breastfeeding.  Physical Exam Physical Exam  Constitutional: She is oriented to person, place, and time. She appears well-developed. No distress.  Pulmonary/Chest: Effort normal and breath sounds normal.  Neurological: She is alert and oriented to person, place, and time.  Psychiatric: She has a normal mood and affect. Her behavior is normal.    Data Reviewed MAU notes and labs  Assessment    S/P complete abortion     Plan    Ortho tricyclen lo        ARNOLD,JAMES 05/02/2014, 11:24 AM

## 2014-06-16 ENCOUNTER — Encounter: Payer: Self-pay | Admitting: Obstetrics & Gynecology

## 2014-11-27 ENCOUNTER — Ambulatory Visit (INDEPENDENT_AMBULATORY_CARE_PROVIDER_SITE_OTHER): Payer: Self-pay | Admitting: Obstetrics & Gynecology

## 2014-11-27 ENCOUNTER — Encounter: Payer: Self-pay | Admitting: Obstetrics & Gynecology

## 2014-11-27 VITALS — BP 101/68 | HR 73 | Ht 61.0 in | Wt 129.8 lb

## 2014-11-27 DIAGNOSIS — N926 Irregular menstruation, unspecified: Secondary | ICD-10-CM

## 2014-11-27 LAB — POCT PREGNANCY, URINE: PREG TEST UR: NEGATIVE

## 2014-11-27 IMAGING — US US OB TRANSVAGINAL
1 series · 14 of 28 positions shown · non-contrast
Comparison: 08/21/2012

CLINICAL DATA: Bleeding for 2 days. Six weeks 6 days pregnant by
last menstrual period. Beta HCG of 137.

EXAM:
OBSTETRIC <14 WK US AND TRANSVAGINAL OB US
TECHNIQUE: Both transabdominal and transvaginal ultrasound examinations were
performed for complete evaluation of the gestation as well as the
maternal uterus, adnexal regions, and pelvic cul-de-sac.
Transvaginal technique was performed to assess early pregnancy.

[Series 1: us ob comp less 14 wks · 56 acquisitions, 14 frames shown]
[im 3/56]
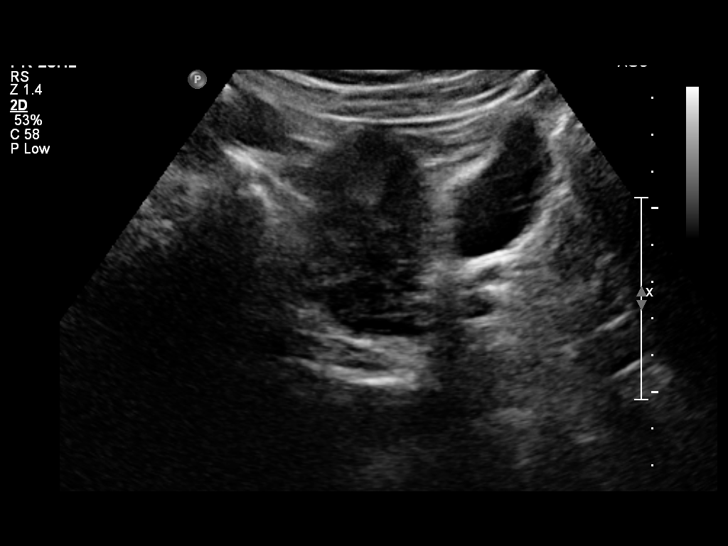
[im 7/56]
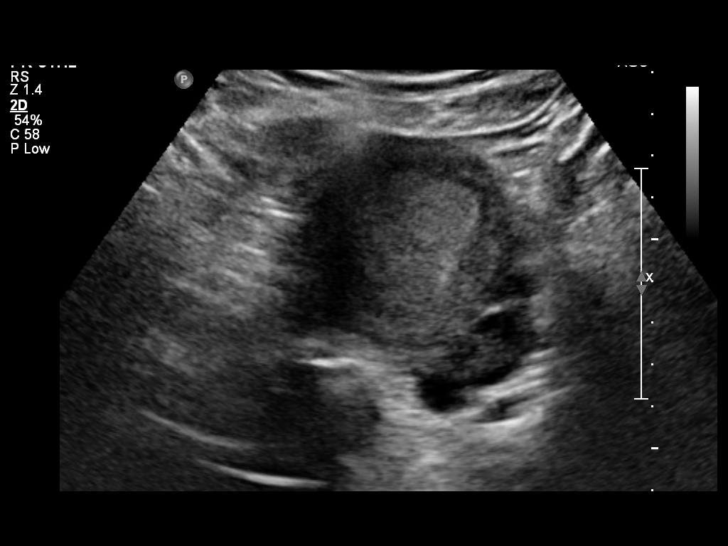
[im 11/56]
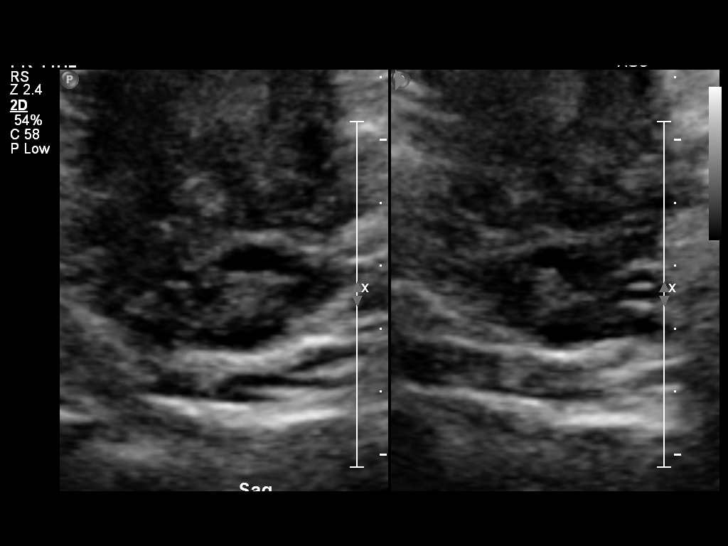
[im 15/56]
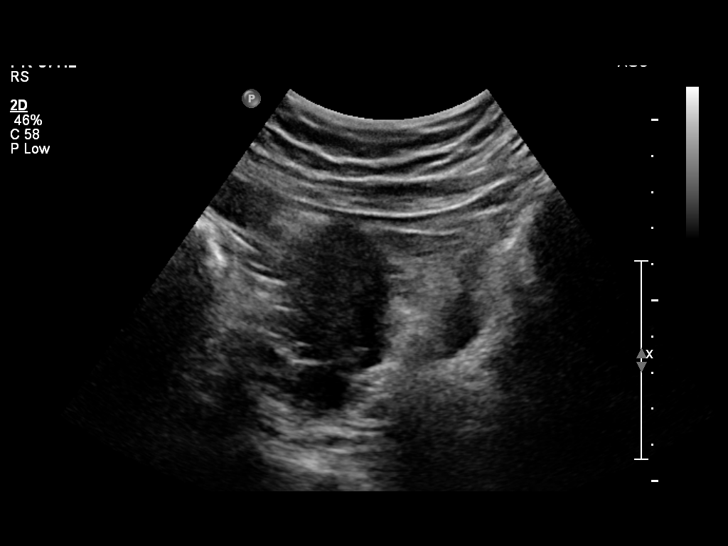
[im 19/56]
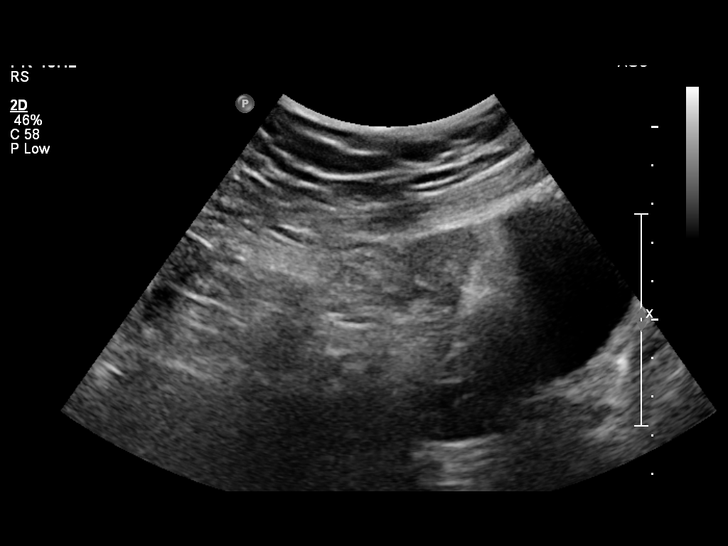
[im 23/56]
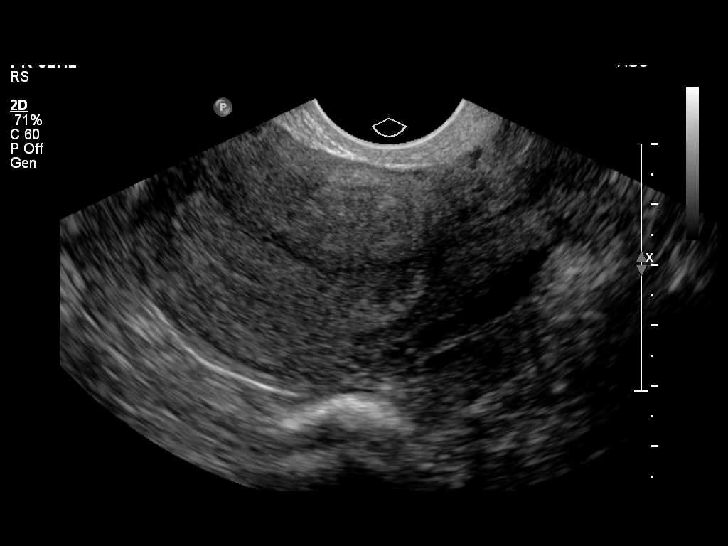
[im 27/56]
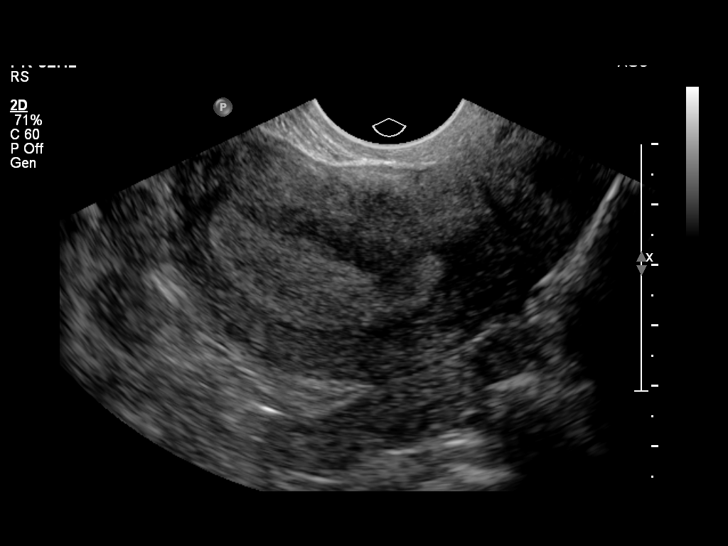
[im 31/56]
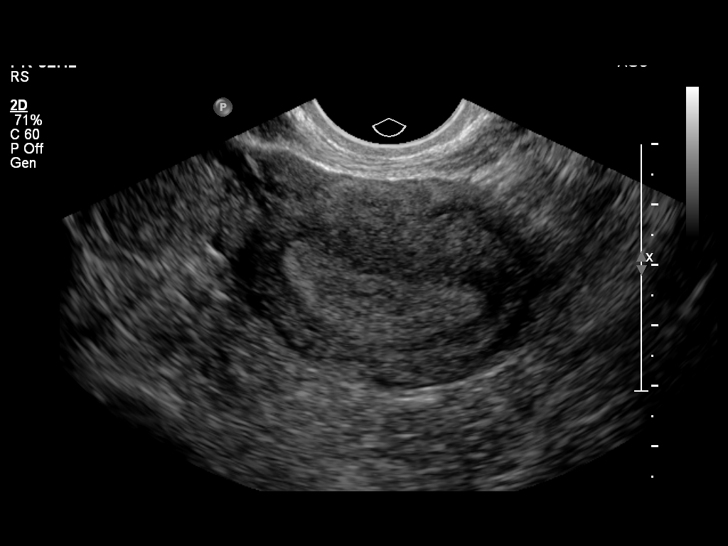
[im 35/56]
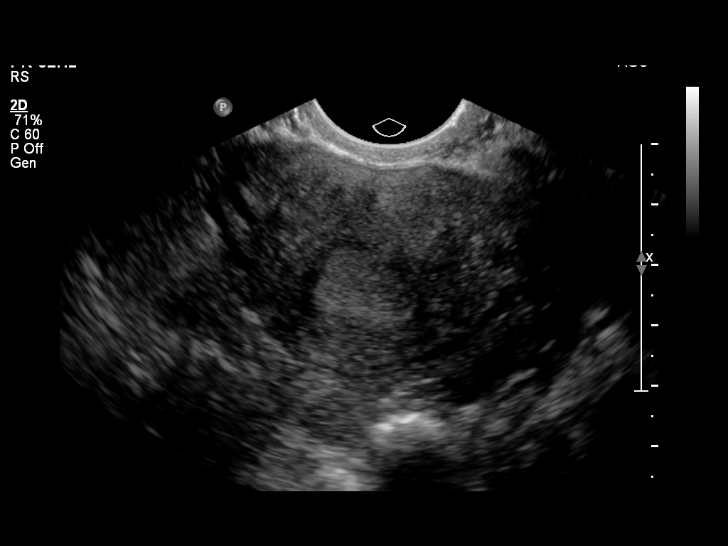
[im 39/56]
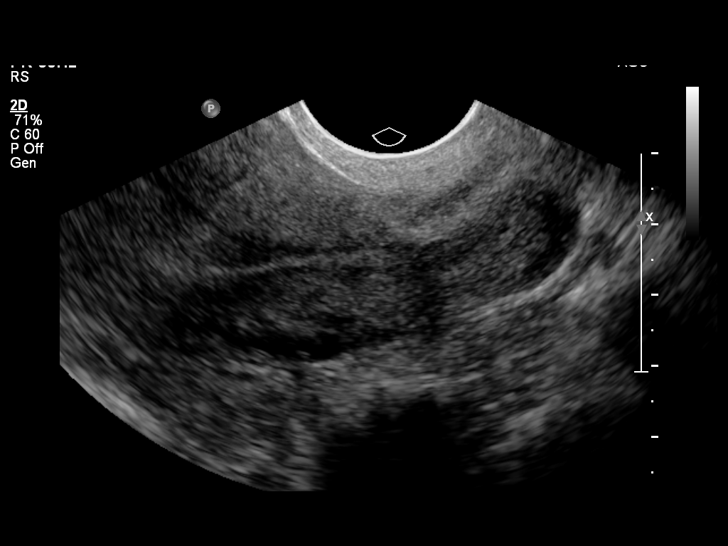
[im 43/56]
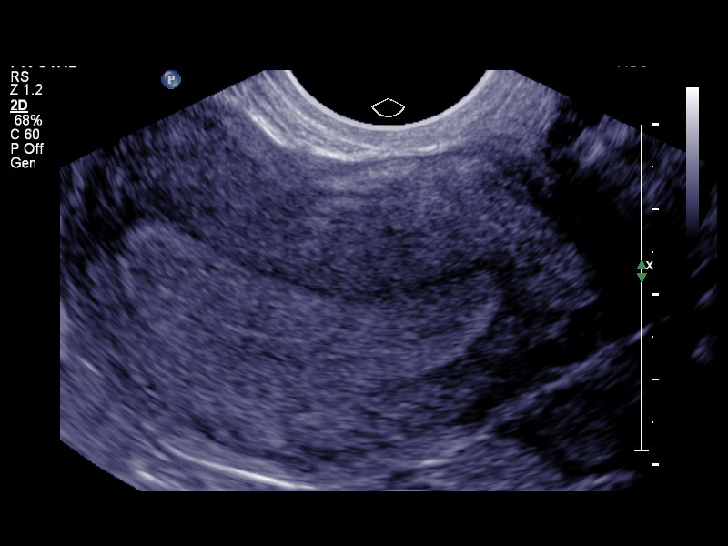
[im 47/56]
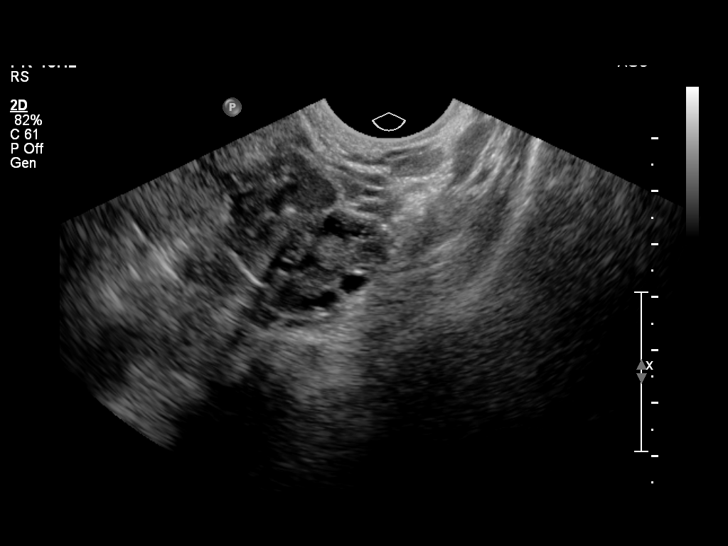
[im 51/56]
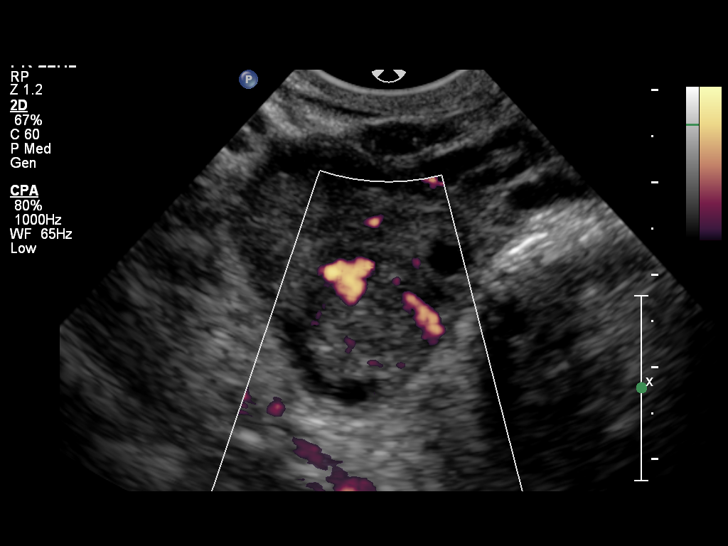
[im 56/56]
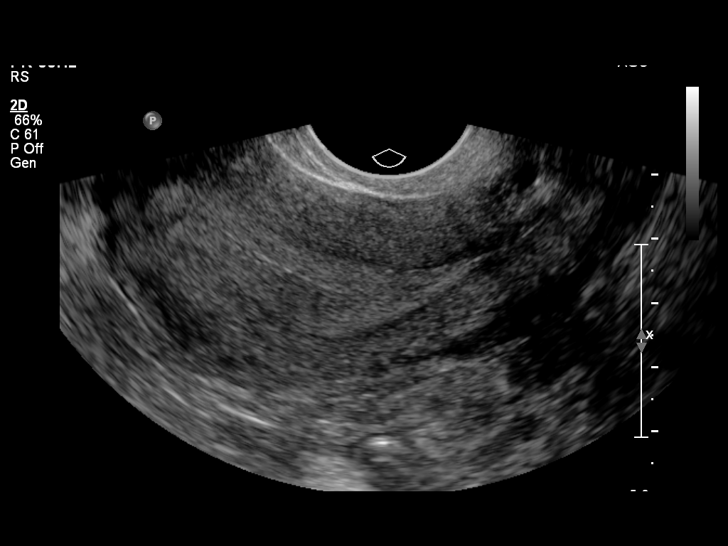

[14 of 28 positions shown; findings below may reference images not displayed]

FINDINGS: Intrauterine gestational sac: Absent. Endometrial thickness upper
normal, 1.4 cm.

Yolk sac:  Absent

Embryo:  Absent

Maternal uterus/adnexae: No subchorionic hemorrhage identified.
Right ovarian hypervascular lesion measures 1.4 cm on image 53. Left
ovary normal in morphology. No significant free fluid.
IMPRESSION: 1. Lack of intrauterine gestational sac or fetal pole. Given beta
HCG level, this is likely due to a early intrauterine pregnancy.
Differential considerations include otherwise occult ectopic
pregnancy and missed abortion. Short-term beta HCG and possibly
ultrasound followup suggested.
2. Right ovarian hypervascular lesion, likely a corpus luteal cyst.

## 2014-11-27 NOTE — Patient Instructions (Signed)
Menstruacin ( Menstruation) La menstruacin es la eliminacin mensual de [place] Citysangre, tejidos, lquidos y mucosidad, tambin conocida como perodo. El organismo elimina el revestimiento del tero. El flujo, o la cantidad de Franniesangre, generalmente dura [place] Sulphur Springsentre 3 y 7das cada mes. Las hormonas son las que controlan el ciclo menstrual. Las hormonas son sustancias qumicas generadas por las glndulas endocrinas para regular las distintas funciones del organismo. El primer perodo menstrual puede comenzar The Krogerentre los 8 y los 16aos. Sin embargo, generalmente comienza alrededor Humana Incde los 12aos. Algunas nias tienen perodos menstruales regulares desde el comienzo. No obstante, no es inusual eliminar solo unas cuantas gotas de sangre o tener un manchado menstrual cuando recin se comienza a Armed forces training and education officermenstruar. Tampoco es inusual Delphitener dos perodos al mes o saltearse uno o dos cuando estos recin comienzan. SNTOMAS   Clicos abdominales leves a moderados.  Dolor en la parte inferior de la espalda. Los sntomas se pueden presentar entre 5 a 10das antes de que comience el perodo menstrual. Estos sntomas se conocen como sndrome premenstrual (SPM) y pueden incluir los siguientes:  Dolor de Turkmenistancabeza.  Sensibilidad e hinchazn en las mamas.  Hinchazn.  Cansancio (fatiga).  Cambios en el humor.  Ansiedad por consumir ciertos alimentos. Estos son signos y sntomas normales y Orthoptistpueden variar en intensidad. Para ayudar a Albertson'saliviar estos problemas, pregntele al mdico si puede tomar medicamentos de venta libre para Chief Technology Officerel dolor o los Evansburgmalestares. Si los sntomas no se pueden controlar, consulte con el mdico.  HORMONAS QUE INTERVIENEN EN LA MENSTRUACIN La menstruacin ocurre debido a las hormonas producidas por la hipfisis en el cerebro y los ovarios que afectan al revestimiento del tero. Primero, la hipfisis en el cerebro produce la hormona folculoestimulante (FSH, por sus siglas en ingls). La FSH estimula a los ovarios  para que produzcan estrgeno, el cual engrosa el revestimiento del tero y comienza a Environmental education officerdesarrollar un vulo en el ovario. Aproximadamente 14 das despus, la hipfisis produce otra hormona llamada hormona luteinizante (LH, por sus siglas en ingls). La LH hace que el vulo salga de la cavidad en el ovario (ovulacin). La prolactina, otra hormona de la hipfisis, estimula la cavidad vaca en el ovario, llamada cuerpo lteo. El cuerpo lteo comienza a Genuine Partsproducir dos hormonas, el estrgeno y Landla progesterona. La progesterona prepara al revestimiento del tero para recibir el vulo fecundado (vulo combinado con espermatozoide) y para que este se adhiera al revestimiento del tero y comience a desarrollarse en un feto. Si el vulo no fue fecundado, el cuerpo lteo deja de producir estrgeno y Education officer, museumprogesterona, desaparece, el revestimiento del tero se desintegra y comienza el perodo menstrual. Luego comienza el ciclo menstrual nuevamente y Educational psychologistcontinuar todos los meses, a menos que ocurra un Psychiatristembarazo o comience la menopausia. La secrecin de hormonas es un proceso complejo. Varias partes del organismo estn involucradas en muchas actividades qumicas. Las hormonas sexuales femeninas tambin cumplen otras funciones en el organismo de la Daileymujer. El estrgeno Toughkenamonaumenta el deseo sexual de Architectural technologistla mujer (libido). Es un diurtico natural ya que ayuda al organismo a deshacerse de los lquidos. Tambin interviene en el proceso de formacin los Hutchinsonhuesos. Por lo tanto, Pharmacologistmantener la salud hormonal es fundamental para todos los niveles del bienestar de la Linvillemujer. Estas hormonas generalmente se encuentran en cantidades normales y son las responsables de Tax adviserla menstruacin. Lo crtico es la relacin World Fuel Services Corporationentre los niveles (reducidos) de estas hormonas. Cuando el equilibrio se Tijerasaltera, se producen irregularidades menstruales. Cmo se produce el ciclo menstrual?  Los ciclos menstruales varan en  duracin de 21 a 35das, con un promedio de 29das. El ciclo  comienza Film/video editorel primer da en que se produce el sangrado. En este momento, la hipfisis en el cerebro libera FSH, que viaja a travs del torrente Yahoo! Incsanguneo hacia los ovarios. La FSH estimula los folculos en los ovarios. Esto prepara al organismo para la ovulacin que ocurre aproximadamente el da14 del ciclo. Los ovarios liberan estrgenos y esto garantiza que las condiciones en el tero sean las adecuadas para la implantacin del vulo fecundado.  Cuando los niveles de estrgenos alcanzan un nivel suficientemente elevado, le envan una seal a la glndula en el cerebro (hipfisis) para que libere una cantidad determinada de LH. Esto provoca la liberacin del vulo maduro del folculo (ovulacin). Generalmente, un folculo Croatialibera solo un vulo, pero a veces libera ms de un vulo, especialmente cuando se estimulan los ovarios para la fertilizacin in vitro. Luego, el vulo puede instalarse en la trompa de Falopio y Programmer, systemsesperar la fertilizacin. El folculo que estall dentro del ovario, y que [place] atrs, ahora se denomina cuerpo lteo o "cuerpo amarillo". El cuerpo lteo sigue liberando (segregando) cantidades reducidas de estrgeno. Esto cierra y endurece el cuello del tero. Seca la mucosidad y la lleva a un estado natural de infertilidad.  El cuerpo lteo tambin comienza a Museum/gallery conservatorliberar cantidades ms grandes de Education officer, museumprogesterona. Esto hace que el revestimiento del tero (endometrio) se vuelva an ms grueso para prepararse para el vulo fecundado. El vulo comienza su trayecto Germaniahacia abajo, desde las trompas de Exelon CorporationFalopio hacia el tero. Y le enva a los ovarios la seal para que no liberen ms vulos. Interviene en el regreso del mucus cervical a su estado de infertilidad.  Si el vulo se implanta exitosamente en el tejido que recubre al tero y se produce el Gillespieembarazo, los niveles de progesterona continuarn Morgantownaumentando. Generalmente, esta es la hormona que les brinda a algunas mujeres embarazadas una sensacin de Kysorvillebienestar,  parecida a una "euforia natural". Los niveles de progesterona vuelven a Software engineerdescender despus del parto  Si no hay fecundacin, el cuerpo lteo muere y deja de producir hormonas. Esta disminucin repentina de progesterona provoca la desintegracin del revestimiento del tero acompaada de sangre (Bethel Islandmenstruacin).  Esto reinicia Recruitment consultantel ciclo en el da1 y todo el proceso comienza nuevamente. Las mujeres atraviesan este ciclo todos los meses, desde la pubertad a la menopausia. El ciclo solo se interrumpe Academic librariandurante el embarazo y Mining engineerel amamantamiento (Market researcherlactancia), a menos que la mujer tenga problemas de salud que afecten al sistema hormonal femenino o elija tomar anticonceptivos orales para tener perodos menstruales no naturales. INSTRUCCIONES PARA EL CUIDADO EN EL HOGAR   Use un calendario para llevar un registro de sus perodos.  Si Botswanausa tampones, compre los menos absorbentes para evitar el sndrome del choque txico.  No deje los tampones en la vagina toda la noche ni por un perodo mayor a 6horas.  Durante la noche use una toalla higinica.  Haga ejercicios de 3 a 5 veces por semana o ms.  Evite los alimentos y las bebidas que sabe que empeorarn sus sntomas antes o durante el perodo. SOLICITE ATENCIN MDICA SI:   Tiene fiebre con el perodo.  Los perodos duran ms de 7das.  El perodo es tan abundante que debe cambiarse las toallas higinicas o los tampones cada 30minutos.  Presenta cogulos con el perodo y nunca antes los Ferdinandtuvo.  No logra aliviar los sntomas con medicamentos de Yorktownventa libre.  Su perodo no ha comenzado y ya han pasado ms  de 35 días. °Document Released: 05/11/2005 Document Revised: 08/06/2013 °ExitCare® Patient Information ©2015 Exit[employer], LLC. This information is not intended to replace advice given to you by your health care provider. Make sure you discuss any questions you have with your health care provider. ° °

## 2014-11-27 NOTE — Progress Notes (Signed)
Patient ID: Alison Zuniga, female   DOB: 1991-02-09, 24 y.o.   MRN: [id number]  Chief Complaint  Patient presents with  . Menorrhagia    HPI Alison Zuniga is a 24 y.o. female.  [id number] Patient's last menstrual period was 10/22/2014. Had irregular vaginal bleeding since miscarriage in Sept. No bleeding since LMP now  HPI  Past Medical History  Diagnosis Date  . No pertinent past medical history     Past Surgical History  Procedure Laterality Date  . Cesarean section      History reviewed. No pertinent family history.  Social History History  Substance Use Topics  . Smoking status: Never Smoker   . Smokeless tobacco: Never Used  . Alcohol Use: No    No Known Allergies  Current Outpatient Prescriptions  Medication Sig Dispense Refill  . Norgestimate-Ethinyl Estradiol Triphasic 0.18/0.215/0.25 MG-25 MCG tab Take 1 tablet by mouth daily. (Patient not taking: Reported on 11/27/2014) 1 Package 11   No current facility-administered medications for this visit.    Review of Systems Review of Systems  Constitutional: Negative.   Genitourinary: Positive for menstrual problem. Negative for vaginal bleeding and vaginal discharge.    Blood pressure 101/68, pulse 73, height 5\' 1"  (1.549 m), weight 129 lb 12.8 oz (58.877 kg), last menstrual period 10/22/2014, unknown if currently breastfeeding.  Physical Exam Physical Exam  Constitutional: She appears well-developed. No distress.  Pulmonary/Chest: Effort normal.  Abdominal: Soft.  Genitourinary: Uterus normal. Vaginal discharge (mucus. wet prep sent) found.  Neurological: She is alert.  Skin: Skin is warm and dry.  Psychiatric: She has a normal mood and affect. Her behavior is normal.    Data Reviewed MAU notes  Assessment    DUB after miscarriage, wants to conceive     Plan    Expectant management for now        Shelvia Fojtik 11/27/2014, 2:47 PM

## 2014-11-28 LAB — WET PREP, GENITAL
CLUE CELLS WET PREP: NONE SEEN
TRICH WET PREP: NONE SEEN
Yeast Wet Prep HPF POC: NONE SEEN

## 2016-05-15 ENCOUNTER — Encounter (HOSPITAL_COMMUNITY): Payer: Self-pay

## 2016-05-15 ENCOUNTER — Emergency Department (HOSPITAL_COMMUNITY): Payer: Self-pay

## 2016-05-15 ENCOUNTER — Inpatient Hospital Stay (HOSPITAL_COMMUNITY)
Admission: EM | Admit: 2016-05-15 | Discharge: 2016-05-17 | DRG: 600 | Disposition: A | Discharge status: Home / Self Care | Payer: Self-pay | Attending: Internal Medicine | Admitting: Internal Medicine

## 2016-05-15 DIAGNOSIS — Z23 Encounter for immunization: Secondary | ICD-10-CM

## 2016-05-15 DIAGNOSIS — E876 Hypokalemia: Secondary | ICD-10-CM | POA: Diagnosis present

## 2016-05-15 DIAGNOSIS — R945 Abnormal results of liver function studies: Secondary | ICD-10-CM | POA: Diagnosis present

## 2016-05-15 DIAGNOSIS — N644 Mastodynia: Secondary | ICD-10-CM

## 2016-05-15 DIAGNOSIS — R509 Fever, unspecified: Secondary | ICD-10-CM

## 2016-05-15 DIAGNOSIS — N611 Abscess of the breast and nipple: Secondary | ICD-10-CM

## 2016-05-15 DIAGNOSIS — N61 Mastitis without abscess: Principal | ICD-10-CM

## 2016-05-15 DIAGNOSIS — R7989 Other specified abnormal findings of blood chemistry: Secondary | ICD-10-CM

## 2016-05-15 DIAGNOSIS — L0390 Cellulitis, unspecified: Secondary | ICD-10-CM | POA: Insufficient documentation

## 2016-05-15 DIAGNOSIS — R6510 Systemic inflammatory response syndrome (SIRS) of non-infectious origin without acute organ dysfunction: Secondary | ICD-10-CM | POA: Diagnosis present

## 2016-05-15 LAB — CBC WITH DIFFERENTIAL/PLATELET
Basophils Absolute: 0 10*3/uL (ref 0.0–0.1)
Basophils Relative: 0 %
EOS ABS: 0 10*3/uL (ref 0.0–0.7)
EOS PCT: 0 %
HCT: 43.4 % (ref 36.0–46.0)
Hemoglobin: 14.5 g/dL (ref 12.0–15.0)
LYMPHS ABS: 1.3 10*3/uL (ref 0.7–4.0)
LYMPHS PCT: 7 %
MCH: 29.1 pg (ref 26.0–34.0)
MCHC: 33.4 g/dL (ref 30.0–36.0)
MCV: 87.1 fL (ref 78.0–100.0)
MONO ABS: 1.1 10*3/uL — AB (ref 0.1–1.0)
Monocytes Relative: 6 %
Neutro Abs: 17.2 10*3/uL — ABNORMAL HIGH (ref 1.7–7.7)
Neutrophils Relative %: 87 %
PLATELETS: 214 10*3/uL (ref 150–400)
RBC: 4.98 MIL/uL (ref 3.87–5.11)
RDW: 12.8 % (ref 11.5–15.5)
WBC: 19.7 10*3/uL — ABNORMAL HIGH (ref 4.0–10.5)

## 2016-05-15 LAB — COMPREHENSIVE METABOLIC PANEL
ALBUMIN: 4 g/dL (ref 3.5–5.0)
ALK PHOS: 107 U/L (ref 38–126)
ALT: 81 U/L — ABNORMAL HIGH (ref 14–54)
ANION GAP: 5 (ref 5–15)
AST: 51 U/L — ABNORMAL HIGH (ref 15–41)
BILIRUBIN TOTAL: 1.9 mg/dL — AB (ref 0.3–1.2)
BUN: 6 mg/dL (ref 6–20)
CALCIUM: 8.9 mg/dL (ref 8.9–10.3)
CO2: 23 mmol/L (ref 22–32)
Chloride: 105 mmol/L (ref 101–111)
Creatinine, Ser: 0.82 mg/dL (ref 0.44–1.00)
GFR calc Af Amer: >60 mL/min (ref >60)
GLUCOSE: 135 mg/dL — AB (ref 65–99)
POTASSIUM: 3.4 mmol/L — AB (ref 3.5–5.1)
Sodium: 133 mmol/L — ABNORMAL LOW (ref 135–145)
TOTAL PROTEIN: 7.5 g/dL (ref 6.5–8.1)

## 2016-05-15 LAB — I-STAT CG4 LACTIC ACID, ED
LACTIC ACID, VENOUS: 1.63 mmol/L (ref 0.5–1.9)
LACTIC ACID, VENOUS: 0.69 mmol/L (ref 0.5–1.9)

## 2016-05-15 LAB — SALICYLATE LEVEL: Salicylate Lvl: <4 mg/dL (ref 2.8–30.0)

## 2016-05-15 LAB — ACETAMINOPHEN LEVEL: Acetaminophen (Tylenol), Serum: <10 ug/mL — ABNORMAL LOW (ref 10–30)

## 2016-05-15 LAB — I-STAT BETA HCG BLOOD, ED (MC, WL, AP ONLY)

## 2016-05-15 LAB — ETHANOL: Alcohol, Ethyl (B): <5 mg/dL (ref <5)

## 2016-05-15 MED ORDER — CLINDAMYCIN PHOSPHATE 600 MG/50ML IV SOLN
600.0000 mg | Freq: Three times a day (TID) | INTRAVENOUS | Status: DC
  Administered 2016-05-15 – 2016-05-17 (×5): 600 mg via INTRAVENOUS
  Filled 2016-05-15 (×5): qty 50

## 2016-05-15 MED ORDER — ACETAMINOPHEN 325 MG PO TABS
650.0000 mg | ORAL_TABLET | Freq: Once | ORAL | Status: AC
  Administered 2016-05-15: 650 mg via ORAL
  Filled 2016-05-15: qty 2

## 2016-05-15 MED ORDER — KETOROLAC TROMETHAMINE 30 MG/ML IJ SOLN
30.0000 mg | Freq: Once | INTRAMUSCULAR | Status: AC
  Administered 2016-05-15: 30 mg via INTRAVENOUS
  Filled 2016-05-15: qty 1

## 2016-05-15 MED ORDER — SODIUM CHLORIDE 0.9 % IV BOLUS (SEPSIS)
1000.0000 mL | Freq: Once | INTRAVENOUS | Status: AC
  Administered 2016-05-15: 1000 mL via INTRAVENOUS

## 2016-05-15 MED ORDER — MORPHINE SULFATE (PF) 4 MG/ML IV SOLN
4.0000 mg | Freq: Once | INTRAVENOUS | Status: AC
Start: 2016-05-15 — End: 2016-05-15
  Administered 2016-05-15: 4 mg via INTRAVENOUS
  Filled 2016-05-15: qty 1

## 2016-05-15 MED ORDER — SULFAMETHOXAZOLE-TRIMETHOPRIM 800-160 MG PO TABS
1.0000 | ORAL_TABLET | Freq: Once | ORAL | Status: AC
  Administered 2016-05-15: 1 via ORAL
  Filled 2016-05-15: qty 1

## 2016-05-15 MED ORDER — HYDROCODONE-ACETAMINOPHEN 5-325 MG PO TABS: 1.0000 | ORAL_TABLET | ORAL | Status: DC | PRN

## 2016-05-15 MED ORDER — SODIUM CHLORIDE 0.9 % IV SOLN
INTRAVENOUS | Status: DC
  Administered 2016-05-15 – 2016-05-16 (×3): via INTRAVENOUS

## 2016-05-15 MED ORDER — ACETAMINOPHEN 500 MG PO TABS: 1000.0000 mg | ORAL_TABLET | Freq: Once | ORAL | Status: DC

## 2016-05-15 MED ORDER — ACETAMINOPHEN 325 MG PO TABS
650.0000 mg | ORAL_TABLET | Freq: Four times a day (QID) | ORAL | Status: DC | PRN
  Administered 2016-05-15 – 2016-05-16 (×3): 650 mg via ORAL
  Filled 2016-05-15 (×4): qty 2

## 2016-05-15 MED ORDER — ONDANSETRON HCL 4 MG PO TABS: 4.0000 mg | ORAL_TABLET | Freq: Four times a day (QID) | ORAL | Status: DC | PRN

## 2016-05-15 MED ORDER — POTASSIUM CHLORIDE CRYS ER 20 MEQ PO TBCR
40.0000 meq | EXTENDED_RELEASE_TABLET | Freq: Once | ORAL | Status: AC
  Administered 2016-05-15: 40 meq via ORAL
  Filled 2016-05-15: qty 2

## 2016-05-15 MED ORDER — ONDANSETRON HCL 4 MG/2ML IJ SOLN: 4.0000 mg | Freq: Four times a day (QID) | INTRAMUSCULAR | Status: DC | PRN

## 2016-05-15 MED ORDER — CEPHALEXIN 250 MG PO CAPS
500.0000 mg | ORAL_CAPSULE | Freq: Once | ORAL | Status: AC
  Administered 2016-05-15: 500 mg via ORAL
  Filled 2016-05-15: qty 2

## 2016-05-15 MED ORDER — KETOROLAC TROMETHAMINE 15 MG/ML IJ SOLN: 15.0000 mg | Freq: Four times a day (QID) | INTRAMUSCULAR | Status: DC | PRN

## 2016-05-15 MED ORDER — ACETAMINOPHEN 650 MG RE SUPP: 650.0000 mg | Freq: Four times a day (QID) | RECTAL | Status: DC | PRN

## 2016-05-15 MED ORDER — SODIUM CHLORIDE 0.9% FLUSH
3.0000 mL | Freq: Two times a day (BID) | INTRAVENOUS | Status: DC
Start: 2016-05-15 — End: 2016-05-17
  Administered 2016-05-15 – 2016-05-17 (×4): 3 mL via INTRAVENOUS

## 2016-05-15 MED ORDER — INFLUENZA VAC SPLIT QUAD 0.5 ML IM SUSY
0.5000 mL | PREFILLED_SYRINGE | INTRAMUSCULAR | Status: AC
  Administered 2016-05-16: 0.5 mL via INTRAMUSCULAR
  Filled 2016-05-15: qty 0.5

## 2016-05-15 NOTE — ED Provider Notes (Signed)
[facility]-EMERGENCY DEPT Provider Note   CSN: [id number] Arrival date & time: 05/15/16  1429     History   Chief Complaint Chief Complaint  Patient presents with  . fever, breast redness    HPI Radonna RickerSaundy Barahona is a 25 y.o. female.  Verlon AuBarahona Romero is a 25 y.o. female who presents to ED for evaluation of right breast pain, gradual-onset over the past 2 days, constant and worsening since the onset. Now associated with subjective fever, chills, myalgias of b/l thighs, fatigue. No nausea/vomiting. Pt denies any h/o similar previously. Not breastfeeding, not pregnant. Denies trauma or injury to breast. Denies Fhx of breast cancer. Denies nipple discharge.    The history is provided by the patient.    History reviewed. No pertinent past medical history.  Patient Active Problem List   Diagnosis Date Noted  . Acute mastitis of right breast 05/15/2016  . Cellulitis of right breast 05/15/2016  . Abnormal LFTs 05/15/2016  . Cellulitis 05/15/2016    Past Surgical History:  Procedure Laterality Date  . CESAREAN SECTION N/A 06/01/2010    OB History    No data available       Home Medications    Prior to Admission medications   Medication Sig Start Date End Date Taking? Authorizing Provider  ibuprofen (ADVIL,MOTRIN) 200 MG tablet Take 200-400 mg by mouth every 6 (six) hours as needed for fever.   Yes Historical Provider, MD    Family History History reviewed. No pertinent family history.  Social History Social History  Substance Use Topics  . Smoking status: Never Smoker  . Smokeless tobacco: Never Used  . Alcohol use No     Allergies   Review of patient's allergies indicates no known allergies.   Review of Systems Review of Systems  Constitutional: Positive for chills, fatigue and fever.  HENT: Negative for congestion.   Respiratory: Negative for shortness of breath.   Cardiovascular: Negative for chest pain.  Gastrointestinal: Positive for nausea. Negative for  abdominal pain and vomiting.  Genitourinary: Negative for flank pain.  Musculoskeletal: Positive for myalgias. Negative for arthralgias.  Skin: Negative for rash.  Neurological: Negative for headaches.  Psychiatric/Behavioral: Negative for confusion.     Physical Exam Updated Vital Signs BP 106/67 (BP Location: Right Arm)   Pulse (!) 112   Temp (!) 100.6 F (38.1 C) (Oral)   Resp (!) 21   Ht 5\' 1"  (1.549 m)   Wt 66.2 kg   LMP 05/04/2016   SpO2 99%   BMI 27.58 kg/m   Physical Exam  Constitutional: She is oriented to person, place, and time. She appears well-developed and well-nourished. No distress.  Pleasant, cooperative, ill but non-toxic appearing  HENT:  Head: Normocephalic and atraumatic.  Eyes: Conjunctivae are normal. No scleral icterus.  Neck: Normal range of motion. Neck supple.  Cardiovascular: Regular rhythm.   No murmur heard. Tachycardic to 120's  Pulmonary/Chest: Effort normal and breath sounds normal. No respiratory distress.  Tenderness and induration with erythemetous streaking of right lateral and inferior breast without draining or fluctuance.   Abdominal: Soft. She exhibits no distension. There is no tenderness.  Musculoskeletal: She exhibits no edema.  Neurological: She is alert and oriented to person, place, and time. She exhibits normal muscle tone. Coordination normal.  Skin: Skin is warm and dry. She is not diaphoretic.  Psychiatric: She has a normal mood and affect.  Nursing note and vitals reviewed.    ED Treatments / Results  Labs (all labs ordered are  listed, but only abnormal results are displayed) Labs Reviewed  COMPREHENSIVE METABOLIC PANEL - Abnormal; Notable for the following:       Result Value   Sodium 133 (*)    Potassium 3.4 (*)    Glucose, Bld 135 (*)    AST 51 (*)    ALT 81 (*)    Total Bilirubin 1.9 (*)    All other components within normal limits  CBC WITH DIFFERENTIAL/PLATELET - Abnormal; Notable for the following:     WBC 19.7 (*)    Neutro Abs 17.2 (*)    Monocytes Absolute 1.1 (*)    All other components within normal limits  ACETAMINOPHEN LEVEL - Abnormal; Notable for the following:    Acetaminophen (Tylenol), Serum <10 (*)    All other components within normal limits  SALICYLATE LEVEL  ETHANOL  COMPREHENSIVE METABOLIC PANEL  HEPATITIS PANEL, ACUTE  PROTIME-INR  I-STAT BETA HCG BLOOD, ED ([facility], [facility], [facility] ONLY)  I-STAT CG4 LACTIC ACID, ED  I-STAT CG4 LACTIC ACID, ED    EKG  EKG Interpretation None       Radiology Dg Chest 2 View  Result Date: 05/15/2016 CLINICAL DATA:  Right breast pain for 2 days.  Nonsmoker. EXAM: CHEST  2 VIEW COMPARISON:  None. FINDINGS: The heart size and mediastinal contours are normal. The lungs are clear. There is no pleural effusion or pneumothorax. No acute osseous findings are identified. Potential mild asymmetric enlargement of the right breast, nonspecific and potentially positional. IMPRESSION: No active cardiopulmonary process. Potential mild asymmetric enlargement of the right breast-correlate clinically. Electronically Signed   By: Carey Bullocks M.D.   On: 05/15/2016 15:38   US Breast Ltd Uni Right Inc Axilla  Result Date: 05/15/2016 CLINICAL DATA:  25 year old female with right breast swelling and fever x3 days. EXAM: Limited ULTRASOUND OF THE right BREAST COMPARISON:  None FINDINGS: On physical exam, right breast swelling noted at 7:00 to 10:00 position Targeted ultrasound is performed, showing diffuse areas of edema in the right breast extending from the 7:00 to 10:00 position approximately 2-9 cm from the nipple. No drainable fluid collection or abscess identified. IMPRESSION: Diffuse soft tissue edema. No drainable fluid collection or abscess. Please note this is a very limited study of the breast for evaluation of possible abscess and is not meant for screening or diagnostic purposes. Electronically Signed   By: Elgie Collard M.D.   On: 05/15/2016 18:29     Procedures Procedures (including critical care time)  Medications Ordered in ED Medications  0.9 %  sodium chloride infusion ( Intravenous New Bag/Given 05/15/16 2213)  sodium chloride flush (NS) 0.9 % injection 3 mL (3 mLs Intravenous Given 05/15/16 2302)  acetaminophen (TYLENOL) tablet 650 mg (650 mg Oral Given 05/15/16 2334)    Or  acetaminophen (TYLENOL) suppository 650 mg ( Rectal See Alternative 05/15/16 2334)  ketorolac (TORADOL) 15 MG/ML injection 15 mg (not administered)  HYDROcodone-acetaminophen (NORCO/VICODIN) 5-325 MG per tablet 1-2 tablet (not administered)  ondansetron (ZOFRAN) tablet 4 mg (not administered)    Or  ondansetron (ZOFRAN) injection 4 mg (not administered)  clindamycin (CLEOCIN) IVPB 600 mg (600 mg Intravenous Given 05/15/16 2302)  Influenza vac split quadrivalent PF (FLUARIX) injection 0.5 mL (not administered)  acetaminophen (TYLENOL) tablet 650 mg (650 mg Oral Given 05/15/16 1447)  sodium chloride 0.9 % bolus 1,000 mL (0 mLs Intravenous Stopped 05/15/16 1645)  morphine 4 MG/ML injection 4 mg (4 mg Intravenous Given 05/15/16 1509)  sulfamethoxazole-trimethoprim (BACTRIM DS,SEPTRA DS) 800-160 MG  per tablet 1 tablet (1 tablet Oral Given 05/15/16 1645)  cephALEXin (KEFLEX) capsule 500 mg (500 mg Oral Given 05/15/16 1645)  ketorolac (TORADOL) 30 MG/ML injection 30 mg (30 mg Intravenous Given 05/15/16 1645)  sodium chloride 0.9 % bolus 1,000 mL (0 mLs Intravenous Stopped 05/15/16 1814)  potassium chloride SA (K-DUR,KLOR-CON) CR tablet 40 mEq (40 mEq Oral Given 05/15/16 2326)     Initial Impression / Assessment and Plan / ED Course  I have reviewed the triage vital signs and the nursing notes.  Pertinent labs & imaging results that were available during my care of the patient were reviewed by me and considered in my medical decision making (see chart for details).  Clinical Course    Breezie Micucci is a 25 y.o. female who presents with two days of right breast  mastitis vs abscess, no abscess on ultrasound. Associated with nausea, fever to 102.7, chills, myalgias of b/l thighs. Positive SIRS criteria, suspect right breast as source, no other infectious sx. Denies Fhx of breast cancer. No nipple discharge. Rapid-onset without h/o similar previously. Not breast feeding, not pregnant. Given 2L NS bolus in ED without much improvement in Hr, given multiple agents for fever control. Admitted for further management, as will require IV fluids, perhaps IV abx. Given bactrim and keflex in Ed.   Pt condition, course, and admission were discussed with attending physician Dr. Melene Plan.  Final Clinical Impressions(s) / ED Diagnoses   Final diagnoses:  Breast pain, right  Mastitis, right, acute    New Prescriptions Current Discharge Medication List       Horald Pollen, MD 05/16/16 0126    Melene Plan, DO 05/16/16 2022

## 2016-05-15 NOTE — Progress Notes (Signed)
Report received from ED RN. Room ready for patient. Alison Zuniga Alison Zuniga

## 2016-05-15 NOTE — ED Triage Notes (Signed)
Patient here with right breast redness and pain with no drainage and fever x 3 days, states that she took ibuprofen this am, also complains of generalized aching to legs, alert and oriented, appears in no distress

## 2016-05-15 NOTE — Progress Notes (Signed)
Attempted to call report,RN unable to take call at this time. Layia Walla, Drinda Buttsharito Joselita, RCharity fundraiser

## 2016-05-15 NOTE — Progress Notes (Addendum)
Pharmacy Antibiotic Note Alison Zuniga is a 25 y.o. female admitted on 05/15/2016 with cellulitis.  Pharmacy has been consulted for clindamycin dosing.  Plan: 1. Begin Clindamycin 600 mg IV every 8 hours   Weight: 142 lb 4 oz (64.5 kg)  Temp (24hrs), Avg:101.2 F (38.4 C), Min:98.8 F (37.1 C), Max:103 F (39.4 C)   Recent Labs Lab 05/15/16 1500 05/15/16 1518 05/15/16 1825  WBC 19.7*  --   --   CREATININE 0.82  --   --   LATICACIDVEN  --  1.63 0.69    CrCl cannot be calculated (Unknown ideal weight.).    No Known Allergies  Antimicrobials this admission: 10/1 Clindamycin  >>   Dose adjustments this admission: n/a  Microbiology results: None to date  Thank you for allowing pharmacy to be a part of this patient's care.  Alison SamplesAndy Demarko Zuniga, PharmD, BCPS 05/15/2016, 10:04 PM Pager: [phone]

## 2016-05-15 NOTE — H&P (Signed)
History and Physical    Alison Zuniga WUJ:[id number] DOB: [birth year] DOA: 05/15/2016  PCP: No PCP Per Patient   Patient coming from: Home  Chief Complaint: Right breast pain with fever x 2 days  HPI: Alison Zuniga is a 25 y.o. woman without significant past medical history who presents to the ED for evaluation of fever associated with  Right breast tenderness and swelling for the past two days.  Her breast pain was acute in onset and without identifiable triggers.  She denies trauma or bites.  She is not breast feeding an infant.  No recent travel or new exposures.  She has not had any nipple discharge.  No skin ulcerations or drainage.  She subsequently developed subjective fevers but no chills.  She has not had nausea or vomiting.  No headache or abdominal pain.  She has been taking Advil at home for her fever, but not in excessive quantities.  She denies acetaminophen use.    ED Course: Temperature in the ED 102.7.  HR in the 130's upon arrival.  Normal lactic acid level of 1.63.  She had a limited bedside U/S that showed diffuse soft tissue swelling in the right breast but no drainable abscess or fluid collection.  She has received 2L of NS.  She received oral keflex and bactrim initially because the ED attending thought that she might discharge home.  Hemodynamics are improved but the patient is still mildly tachycardic.  Patient referred for observation admission for surveillance, IV antibiotics, additional IV fluid resuscitation, and formal imaging of her breast.  Of note, she also has abnormal LFTs on her routine labs drawn in the ED.  She denies any known history of liver or gallbladder disease.  No history of unexplained RUQ pain or tenderness.  She denies excessive use of OTC analgesics.  She denies any known history of hepatitis exposure.  No history of IV drug abuse.  No EtOH use.  Review of Systems: As per HPI otherwise 10 point review of systems negative.    History reviewed. No  pertinent past medical history.  History reviewed. No pertinent surgical history.  She has one vaginal delivery and one Cesarean delivery.   reports that she has never smoked. She has never used smokeless tobacco. Her alcohol and drug histories are not on file.  She denies tobacco, EtOH, or illicit drug use.  She is married.  She has two children.  She works in a factory.  She is Spanish-speaking.  No Known Allergies  FAMILY HISTORY: She denies any known family history of heart disease or diabetes.  Prior to Admission medications   Not on File  She is not on any prescription drugs.  Physical Exam: Vitals:   05/15/16 1715 05/15/16 1730 05/15/16 1815 05/15/16 1815  BP: 96/60 91/62 97/58    Pulse: (!) 122 118 115   Resp: 25 26 (!) 27   Temp:    100.4 F (38 C)  TempSrc:    Oral  SpO2: 97% 97% 98%   Weight:          Constitutional: NAD, somewhat ill appearing Vitals:   05/15/16 1715 05/15/16 1730 05/15/16 1815 05/15/16 1815  BP: 96/60 91/62 97/58    Pulse: (!) 122 118 115   Resp: 25 26 (!) 27   Temp:    100.4 F (38 C)  TempSrc:    Oral  SpO2: 97% 97% 98%   Weight:       Eyes: PERRL, lids and conjunctivae normal ENMT: Mucous membranes  are moist. Posterior pharynx clear of any exudate or lesions. Normal dentition.  Neck: normal appearance, supple Respiratory: clear to auscultation bilaterally, no wheezing, no crackles. Normal respiratory effort. No accessory muscle use.  Cardiovascular: Tachycardic but regular.  No murmurs / rubs / gallops. No extremity edema. 2+ pedal pulses. GI: abdomen is soft and compressible.  No distention.  No tenderness.  Bowel sounds are present. Musculoskeletal:  No joint deformity in upper and lower extremities. Good ROM, no contractures. Normal muscle tone.  Skin: no rashes, warm and dry Neurologic: No focal deficits. Psychiatric: Normal judgment and insight. Alert and oriented x 3. Normal mood.  BREAST: right breast has lateral erythema,  diffuse, with tenderness to palpation and warmth.  No fluctuance.  No apparent areolar involvement.  No skin breakdown.  No active drainage.   Labs on Admission: I have personally reviewed following labs and imaging studies  CBC:  Recent Labs Lab 05/15/16 1500  WBC 19.7*  NEUTROABS 17.2*  HGB 14.5  HCT 43.4  MCV 87.1  PLT 214   Basic Metabolic Panel:  Recent Labs Lab 05/15/16 1500  NA 133*  K 3.4*  CL 105  CO2 23  GLUCOSE 135*  BUN 6  CREATININE 0.82  CALCIUM 8.9   GFR: CrCl cannot be calculated (Unknown ideal weight.). Liver Function Tests:  Recent Labs Lab 05/15/16 1500  AST 51*  ALT 81*  ALKPHOS 107  BILITOT 1.9*  PROT 7.5  ALBUMIN 4.0   Sepsis Labs:  Lactic acid level 1.63  Radiological Exams on Admission: Dg Chest 2 View  Result Date: 05/15/2016 CLINICAL DATA:  Right breast pain for 2 days.  Nonsmoker. EXAM: CHEST  2 VIEW COMPARISON:  None. FINDINGS: The heart size and mediastinal contours are normal. The lungs are clear. There is no pleural effusion or pneumothorax. No acute osseous findings are identified. Potential mild asymmetric enlargement of the right breast, nonspecific and potentially positional. IMPRESSION: No active cardiopulmonary process. Potential mild asymmetric enlargement of the right breast-correlate clinically. Electronically Signed   By: Carey Bullocks M.D.   On: 05/15/2016 15:38   US Breast Ltd Uni Right Inc Axilla  Result Date: 05/15/2016 CLINICAL DATA:  25 year old female with right breast swelling and fever x3 days. EXAM: Limited ULTRASOUND OF THE right BREAST COMPARISON:  None FINDINGS: On physical exam, right breast swelling noted at 7:00 to 10:00 position Targeted ultrasound is performed, showing diffuse areas of edema in the right breast extending from the 7:00 to 10:00 position approximately 2-9 cm from the nipple. No drainable fluid collection or abscess identified. IMPRESSION: Diffuse soft tissue edema. No drainable fluid  collection or abscess. Please note this is a very limited study of the breast for evaluation of possible abscess and is not meant for screening or diagnostic purposes. Electronically Signed   By: Elgie Collard M.D.   On: 05/15/2016 18:29    Assessment/Plan Active Problems:   Acute mastitis of right breast   Cellulitis of right breast   Abnormal LFTs   Cellulitis      Right breast cellulitis with SIRS but no evidence of sepsis --IV clindamycin --Blood cultures for recurrent fever greater than 100.4 --Complete breast ultrasound in the AM --NS at 125cc/hr --Repeat CBC in the AM --Check U/A  Abnormal LFTs, asymptomatic --Will check acute hepatitis panel, EtOH, acetaminophen, salicylate levels, UDS for now --Repeat CMP in the AM to look for trend --RUQ ultrasound deferred for now since she does not have localizing symptoms  Hypokalemia --Replacement ordered  DVT prophylaxis: Low risk, Early ambulation Code Status: FULL Family Communication: Husband, children at bedside at time of admission.  Patient also interviewed with Spanish interpreter per her request. Disposition Plan: Expect she will go home when ready for discharge. Consults called: NONE Admission status: Observation, telemetry   TIME SPENT: 55 minutes   Jerene Bears MD [facility] Pager [phone]  If 7PM-7AM, please contact night-coverage [email or web address] Password [email or web address]  05/15/2016, 9:02 PM

## 2016-05-16 DIAGNOSIS — R509 Fever, unspecified: Secondary | ICD-10-CM

## 2016-05-16 DIAGNOSIS — L0291 Cutaneous abscess, unspecified: Secondary | ICD-10-CM | POA: Insufficient documentation

## 2016-05-16 LAB — COMPREHENSIVE METABOLIC PANEL
ALK PHOS: 123 U/L (ref 38–126)
ALT: 70 U/L — ABNORMAL HIGH (ref 14–54)
ANION GAP: 5 (ref 5–15)
AST: 43 U/L — ABNORMAL HIGH (ref 15–41)
Albumin: 3.6 g/dL (ref 3.5–5.0)
BUN: <5 mg/dL — ABNORMAL LOW (ref 6–20)
CALCIUM: 8.9 mg/dL (ref 8.9–10.3)
CHLORIDE: 109 mmol/L (ref 101–111)
CO2: 23 mmol/L (ref 22–32)
CREATININE: 0.69 mg/dL (ref 0.44–1.00)
GFR calc non Af Amer: >60 mL/min (ref >60)
Glucose, Bld: 106 mg/dL — ABNORMAL HIGH (ref 65–99)
Potassium: 3.7 mmol/L (ref 3.5–5.1)
SODIUM: 137 mmol/L (ref 135–145)
TOTAL PROTEIN: 7.6 g/dL (ref 6.5–8.1)
Total Bilirubin: 1.3 mg/dL — ABNORMAL HIGH (ref 0.3–1.2)

## 2016-05-16 LAB — URINE MICROSCOPIC-ADD ON

## 2016-05-16 LAB — URINALYSIS, ROUTINE W REFLEX MICROSCOPIC
Bilirubin Urine: NEGATIVE
GLUCOSE, UA: NEGATIVE mg/dL
KETONES UR: NEGATIVE mg/dL
Nitrite: NEGATIVE
PROTEIN: NEGATIVE mg/dL
Specific Gravity, Urine: <1.005 — ABNORMAL LOW (ref 1.005–1.030)
pH: 7 (ref 5.0–8.0)

## 2016-05-16 LAB — RAPID URINE DRUG SCREEN, HOSP PERFORMED
Amphetamines: NOT DETECTED
Barbiturates: NOT DETECTED
Benzodiazepines: NOT DETECTED
Cocaine: NOT DETECTED
Opiates: NOT DETECTED
Tetrahydrocannabinol: NOT DETECTED

## 2016-05-16 LAB — PROTIME-INR
INR: 1.29
INR: 1.22
Prothrombin Time: 16.2 seconds — ABNORMAL HIGH (ref 11.4–15.2)
Prothrombin Time: 15.4 seconds — ABNORMAL HIGH (ref 11.4–15.2)

## 2016-05-16 NOTE — Progress Notes (Signed)
PROGRESS NOTE    Alison Zuniga  ZOX:[id number] DOB: February 25, 1991 DOA: 05/15/2016 PCP: No PCP Per Patient   Outpatient Specialists:    Brief Narrative:  Alison Zuniga is a 25 y.o. woman without significant past medical history who presents to the ED for evaluation of fever associated with  Right breast tenderness and swelling for the past two days.  Her breast pain was acute in onset and without identifiable triggers.  She denies trauma or bites.  She is not breast feeding an infant.  No recent travel or new exposures.  She has not had any nipple discharge.  No skin ulcerations or drainage.  She subsequently developed subjective fevers but no chills.  She has not had nausea or vomiting.  No headache or abdominal pain.  She has been taking Advil at home for her fever, but not in excessive quantities.  She denies acetaminophen use.     Assessment & Plan:   Active Problems:   Cellulitis of right breast   Abnormal LFTs   Fever   Right breast cellulitis with SIRS but no evidence of sepsis --IV clindamycin --Blood cultures x 2 --called by U/S this AM and recommended not to repeat U/S so soon- will order in AM if not much improved --NS at 125cc/hr --Repeat CBC in the AM  Abnormal LFTs, asymptomatic -- acute hepatitis panel -EtOH, acetaminophen, salicylate levels, UDS-- negative --Repeat CMP in the AM --RUQ ultrasound deferred for now since she does not have localizing symptoms  Hypokalemia --Repleted   DVT prophylaxis:  SCD's  Code Status: Full Code   Family Communication: patient  Disposition Plan:  Home in AM?   Consultants:     Subjective: Says she feels better but had fever this AM Asking to go home  Objective: Vitals:   05/15/16 2130 05/15/16 2142 05/15/16 2202 05/16/16 0530  BP: 102/71  106/67 104/65  Pulse: 114  (!) 112 (!) 111  Resp:   (!) 21 20  Temp:  98.8 F (37.1 C) (!) 100.6 F (38.1 C) (!) 102.5 F (39.2 C)  TempSrc:  Oral Oral  Oral  SpO2: 99%  99% 100%  Weight:   66.2 kg (145 lb 15.1 oz)   Height:   5\' 1"  (1.549 m)     Intake/Output Summary (Last 24 hours) at 05/16/16 0928 Last data filed at 05/16/16 0700  Gross per 24 hour  Intake          1437.92 ml  Output              900 ml  Net           537.92 ml   Filed Weights   05/15/16 1438 05/15/16 2202  Weight: 64.5 kg (142 lb 4 oz) 66.2 kg (145 lb 15.1 oz)    Examination:  General exam: Appears calm and comfortable  Respiratory system: Clear to auscultation. Respiratory effort normal. Cardiovascular system: tachy, murmurs, rubs, gallops or clicks. No pedal edema. Gastrointestinal system: Abdomen is nondistended, soft and nontender. No organomegaly or masses felt. Normal bowel sounds heard. Central nervous system: Alert and oriented. No focal neurological deficits. Skin: tender in 11pM position on breast exam Psychiatry: Judgement and insight appear normal. Mood & affect appropriate.     Data Reviewed: I have personally reviewed following labs and imaging studies  CBC:  Recent Labs Lab 05/15/16 1500  WBC 19.7*  NEUTROABS 17.2*  HGB 14.5  HCT 43.4  MCV 87.1  PLT 214   Basic Metabolic Panel:  Recent Labs Lab 05/15/16 1500  NA 133*  K 3.4*  CL 105  CO2 23  GLUCOSE 135*  BUN 6  CREATININE 0.82  CALCIUM 8.9   GFR: Estimated Creatinine Clearance: 90.6 mL/min (by C-G formula based on SCr of 0.82 mg/dL). Liver Function Tests:  Recent Labs Lab 05/15/16 1500  AST 51*  ALT 81*  ALKPHOS 107  BILITOT 1.9*  PROT 7.5  ALBUMIN 4.0   No results for input(s): LIPASE, AMYLASE in the last 168 hours. No results for input(s): AMMONIA in the last 168 hours. Coagulation Profile: No results for input(s): INR, PROTIME in the last 168 hours. Cardiac Enzymes: No results for input(s): CKTOTAL, CKMB, CKMBINDEX, TROPONINI in the last 168 hours. BNP (last 3 results) No results for input(s): PROBNP in the last 8760 hours. HbA1C: No results for  input(s): HGBA1C in the last 72 hours. CBG: No results for input(s): GLUCAP in the last 168 hours. Lipid Profile: No results for input(s): CHOL, HDL, LDLCALC, TRIG, CHOLHDL, LDLDIRECT in the last 72 hours. Thyroid Function Tests: No results for input(s): TSH, T4TOTAL, FREET4, T3FREE, THYROIDAB in the last 72 hours. Anemia Panel: No results for input(s): VITAMINB12, FOLATE, FERRITIN, TIBC, IRON, RETICCTPCT in the last 72 hours. Urine analysis:    Component Value Date/Time   COLORURINE YELLOW 05/16/2016 0244   APPEARANCEUR CLOUDY (A) 05/16/2016 0244   LABSPEC <1.005 (L) 05/16/2016 0244   PHURINE 7.0 05/16/2016 0244   GLUCOSEU NEGATIVE 05/16/2016 0244   HGBUR SMALL (A) 05/16/2016 0244   BILIRUBINUR NEGATIVE 05/16/2016 0244   KETONESUR NEGATIVE 05/16/2016 0244   PROTEINUR NEGATIVE 05/16/2016 0244   NITRITE NEGATIVE 05/16/2016 0244   LEUKOCYTESUR TRACE (A) 05/16/2016 0244     )No results found for this or any previous visit (from the past 240 hour(s)).    Anti-infectives    Start     Dose/Rate Route Frequency Ordered Stop   05/15/16 2215  clindamycin (CLEOCIN) IVPB 600 mg     600 mg 100 mL/hr over 30 Minutes Intravenous Every 8 hours 05/15/16 2203     05/15/16 1530  sulfamethoxazole-trimethoprim (BACTRIM DS,SEPTRA DS) 800-160 MG per tablet 1 tablet     1 tablet Oral  Once 05/15/16 1517 05/15/16 1645   05/15/16 1530  cephALEXin (KEFLEX) capsule 500 mg     500 mg Oral  Once 05/15/16 1517 05/15/16 1645       Radiology Studies: Dg Chest 2 View  Result Date: 05/15/2016 CLINICAL DATA:  Right breast pain for 2 days.  Nonsmoker. EXAM: CHEST  2 VIEW COMPARISON:  None. FINDINGS: The heart size and mediastinal contours are normal. The lungs are clear. There is no pleural effusion or pneumothorax. No acute osseous findings are identified. Potential mild asymmetric enlargement of the right breast, nonspecific and potentially positional. IMPRESSION: No active cardiopulmonary process.  Potential mild asymmetric enlargement of the right breast-correlate clinically. Electronically Signed   By: Carey Bullocks M.D.   On: 05/15/2016 15:38   US Breast Ltd Uni Right Inc Axilla  Result Date: 05/15/2016 CLINICAL DATA:  25 year old female with right breast swelling and fever x3 days. EXAM: Limited ULTRASOUND OF THE right BREAST COMPARISON:  None FINDINGS: On physical exam, right breast swelling noted at 7:00 to 10:00 position Targeted ultrasound is performed, showing diffuse areas of edema in the right breast extending from the 7:00 to 10:00 position approximately 2-9 cm from the nipple. No drainable fluid collection or abscess identified. IMPRESSION: Diffuse soft tissue edema. No drainable fluid collection or abscess.  Please note this is a very limited study of the breast for evaluation of possible abscess and is not meant for screening or diagnostic purposes. Electronically Signed   By: Elgie CollardArash  Radparvar M.D.   On: 05/15/2016 18:29        Scheduled Meds: . clindamycin (CLEOCIN) IV  600 mg Intravenous Q8H  . Influenza vac split quadrivalent PF  0.5 mL Intramuscular Tomorrow-1000  . sodium chloride flush  3 mL Intravenous Q12H   Continuous Infusions: . sodium chloride 125 mL/hr at 05/16/16 0655     LOS: 0 days    Time spent: 25 min    Vasilia Dise Juanetta GoslingU Nami Strawder, DO [facility] Pager [phone]  If 7PM-7AM, please contact night-coverage [email or web address] Password [email or web address] 05/16/2016, 9:28 AM

## 2016-05-16 NOTE — Progress Notes (Signed)
New Admission Note:   Arrival Method: [facility] Mental Orientation: Alert and oriented x4 Telemetry: Box #4 Assessment: Completed Skin: Redness,swelling to rt breast IV: Rt AC Pain: See doc flowsheet Tubes: N/A Safety Measures: Safety Fall Prevention Plan has been given, discussed. Admission: Completed [address] Orientation: Patient has been orientated to the room, unit and staff.  Family: None at bedside.  Orders have been reviewed and implemented. Will continue to monitor the patient. Call light has been placed within reach and bed alarm has been activated.   Toll BrothersCharito Marg Macmaster BSN, RN-BC Phone number: [phone]

## 2016-05-17 LAB — CBC
HEMATOCRIT: 38 % (ref 36.0–46.0)
Hemoglobin: 12.3 g/dL (ref 12.0–15.0)
MCH: 28.5 pg (ref 26.0–34.0)
MCHC: 32.4 g/dL (ref 30.0–36.0)
MCV: 88.2 fL (ref 78.0–100.0)
Platelets: 195 10*3/uL (ref 150–400)
RBC: 4.31 MIL/uL (ref 3.87–5.11)
RDW: 12.9 % (ref 11.5–15.5)
WBC: 13.6 10*3/uL — AB (ref 4.0–10.5)

## 2016-05-17 LAB — COMPREHENSIVE METABOLIC PANEL
ALT: 81 U/L — ABNORMAL HIGH (ref 14–54)
AST: 56 U/L — AB (ref 15–41)
Albumin: 3.1 g/dL — ABNORMAL LOW (ref 3.5–5.0)
Alkaline Phosphatase: 114 U/L (ref 38–126)
Anion gap: 8 (ref 5–15)
BUN: <5 mg/dL — ABNORMAL LOW (ref 6–20)
CHLORIDE: 110 mmol/L (ref 101–111)
CO2: 20 mmol/L — AB (ref 22–32)
Calcium: 8.8 mg/dL — ABNORMAL LOW (ref 8.9–10.3)
Creatinine, Ser: 0.64 mg/dL (ref 0.44–1.00)
Glucose, Bld: 102 mg/dL — ABNORMAL HIGH (ref 65–99)
POTASSIUM: 3.7 mmol/L (ref 3.5–5.1)
SODIUM: 138 mmol/L (ref 135–145)
Total Bilirubin: 1.1 mg/dL (ref 0.3–1.2)
Total Protein: 6.5 g/dL (ref 6.5–8.1)

## 2016-05-17 LAB — HEPATITIS PANEL, ACUTE
HCV AB: 0.4 {s_co_ratio} (ref 0.0–0.9)
HEP B C IGM: NEGATIVE
HEP B S AG: NEGATIVE
Hep A IgM: NEGATIVE

## 2016-05-17 MED ORDER — RISAQUAD PO CAPS
2.0000 | ORAL_CAPSULE | Freq: Every day | ORAL | Status: DC
  Administered 2016-05-17: 2 via ORAL
  Filled 2016-05-17: qty 2

## 2016-05-17 MED ORDER — CLINDAMYCIN HCL 300 MG PO CAPS
600.0000 mg | ORAL_CAPSULE | Freq: Three times a day (TID) | ORAL | Status: DC
  Administered 2016-05-17 (×2): 600 mg via ORAL
  Filled 2016-05-17 (×2): qty 2

## 2016-05-17 MED ORDER — RISAQUAD PO CAPS: 2.0000 | ORAL_CAPSULE | Freq: Every day | ORAL | 0 refills | Status: AC

## 2016-05-17 MED ORDER — CLINDAMYCIN HCL 300 MG PO CAPS: 600.0000 mg | ORAL_CAPSULE | Freq: Three times a day (TID) | ORAL | 0 refills | Status: AC

## 2016-05-17 NOTE — Progress Notes (Signed)
Patient Discharge: Disposition: Patient discharged to home. Education: Reviewed medications, prescriptions, follow-up appointment and discharge instructions, with the help of interpreter, understood and acknowledged. IV: Discontinued IV before discharge. Telemetry: N/A Transportation: Patient escorted out in w/c out of the unit. Belongings: Patient took all her belongings with her.

## 2016-05-17 NOTE — Progress Notes (Signed)
Patient requested Spanish interpreter for more deeper understanding of her situation as she was getting discharged today. Notified for bedside interpreter to come around 3:30 pm as Dr. Benjamine MolaVann was going to come to answer all her questions.   Interpreter stayed till discharge.

## 2016-05-17 NOTE — Care Management Note (Signed)
Case Management Note  Patient Details  Name: Catarina HartshornSaundy Boromeo Barahona MRN: [id number] Date of Birth: 15-Mar-1991  Subjective/Objective:   Cellulitis of right breast                 Action/Plan: Discharge Planning: AVS reviewed:  NCM spoke to pt and states she works full-time. 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 Once her benefits start pt with be able to utilize her insurance for PCP. Provided pt with coupon with goodrx for Clindamycin for $16.90. Provided pt with list of indigent clinics in [place].     Expected Discharge Date:  05/17/2016               Expected Discharge Plan:  Home/Self Care  In-House Referral:  NA  Discharge planning Services  CM Consult, Indigent Health Clinic, Medication Assistance  Post Acute Care Choice:  NA Choice offered to:  NA  DME Arranged:  N/A DME Agency:  NA  HH Arranged:  NA HH Agency:  NA  Status of Service:  Completed, signed off  If discussed at Long Length of Stay Meetings, dates discussed:    Additional Comments:  Elliot CousinShavis, Ocia Simek Ellen, RN 05/17/2016, 2:25 PM

## 2016-05-17 NOTE — Discharge Summary (Signed)
Physician Discharge Summary  Alison Zuniga YWV:[id number] DOB: 1991/07/03 DOA: 05/15/2016  PCP: No PCP Per Patient- will [facility]  Admit date: 05/15/2016 Discharge date: 05/17/2016   Recommendations for Outpatient Follow-Up:   Follow liver enzymes to ensure completion  Discharge Diagnosis:   Active Problems:   Cellulitis of right breast   Abnormal LFTs   Fever   Discharge disposition:  Home  Discharge Condition: Improved.  Diet recommendation:  Regular.  Wound care: avoid cuts with shaving under arms and metal push up bras   History of Present Illness:   Alison Zuniga is a 25 y.o. woman without significant past medical history who presents to the ED for evaluation of fever associated with  Right breast tenderness and swelling for the past two days.  Her breast pain was acute in onset and without identifiable triggers.  She denies trauma or bites.  She is not breast feeding an infant.  No recent travel or new exposures.  She has not had any nipple discharge.  No skin ulcerations or drainage.  She subsequently developed subjective fevers but no chills.  She has not had nausea or vomiting.  No headache or abdominal pain.  She has been taking Advil at home for her fever, but not in excessive quantities.  She denies acetaminophen use.     Hospital Course by Problem:   Right breast cellulitis with SIRS but no evidence of sepsis --IV clindamycin changed to PO and will finish 10 day course --Blood cultures x 2 NGTD --called by U/S this AM and recommended not to repeat U/S so soon- patient much improved on day of d/c -WBC normalizing  Abnormal LFTs, asymptomatic -- acute hepatitis panel negative -EtOH, acetaminophen, salicylate levels, UDS-- negative --outpatient follow up  Hypokalemia --Repleted    Medical Consultants:    None.   Discharge Exam:   Vitals:   05/17/16 0900 05/17/16 1117  BP: 100/69   Pulse: 85   Resp: 18     Temp: 99.3 F (37.4 C) 98.6 F (37 C)   Vitals:   05/16/16 2053 05/17/16 0457 05/17/16 0900 05/17/16 1117  BP: 92/60 (!) 94/54 100/69   Pulse: 84 84 85   Resp: 20 18 18    Temp: 98.9 F (37.2 C) 99.1 F (37.3 C) 99.3 F (37.4 C) 98.6 F (37 C)  TempSrc: Oral Oral Oral   SpO2: 98% 100% 100%   Weight: 67.1 kg (147 lb 14.9 oz)     Height:        Gen:  NAD-- no fever, anxious to go home   The results of significant diagnostics from this hospitalization (including imaging, microbiology, ancillary and laboratory) are listed below for reference.     Procedures and Diagnostic Studies:   Dg Chest 2 View  Result Date: 05/15/2016 CLINICAL DATA:  Right breast pain for 2 days.  Nonsmoker. EXAM: CHEST  2 VIEW COMPARISON:  None. FINDINGS: The heart size and mediastinal contours are normal. The lungs are clear. There is no pleural effusion or pneumothorax. No acute osseous findings are identified. Potential mild asymmetric enlargement of the right breast, nonspecific and potentially positional. IMPRESSION: No active cardiopulmonary process. Potential mild asymmetric enlargement of the right breast-correlate clinically. Electronically Signed   By: Carey Bullocks M.D.   On: 05/15/2016 15:38   US Breast Ltd Uni Right Inc Axilla  Result Date: 05/15/2016 CLINICAL DATA:  25 year old female with right breast swelling and fever x3 days. EXAM: Limited ULTRASOUND OF THE right  BREAST COMPARISON:  None FINDINGS: On physical exam, right breast swelling noted at 7:00 to 10:00 position Targeted ultrasound is performed, showing diffuse areas of edema in the right breast extending from the 7:00 to 10:00 position approximately 2-9 cm from the nipple. No drainable fluid collection or abscess identified. IMPRESSION: Diffuse soft tissue edema. No drainable fluid collection or abscess. Please note this is a very limited study of the breast for evaluation of possible abscess and is not meant for screening or diagnostic  purposes. Electronically Signed   By: Elgie CollardArash  Radparvar M.D.   On: 05/15/2016 18:29     Labs:   Basic Metabolic Panel:  Recent Labs Lab 05/15/16 1500 05/16/16 1412 05/17/16 0424  NA 133* 137 138  K 3.4* 3.7 3.7  CL 105 109 110  CO2 23 23 20*  GLUCOSE 135* 106* 102*  BUN 6 <5* <5*  CREATININE 0.82 0.69 0.64  CALCIUM 8.9 8.9 8.8*   GFR Estimated Creatinine Clearance: 93.4 mL/min (by C-G formula based on SCr of 0.64 mg/dL). Liver Function Tests:  Recent Labs Lab 05/15/16 1500 05/16/16 1412 05/17/16 0424  AST 51* 43* 56*  ALT 81* 70* 81*  ALKPHOS 107 123 114  BILITOT 1.9* 1.3* 1.1  PROT 7.5 7.6 6.5  ALBUMIN 4.0 3.6 3.1*   No results for input(s): LIPASE, AMYLASE in the last 168 hours. No results for input(s): AMMONIA in the last 168 hours. Coagulation profile  Recent Labs Lab 05/16/16 0928 05/16/16 1412  INR 1.29 1.22    CBC:  Recent Labs Lab 05/15/16 1500 05/17/16 0424  WBC 19.7* 13.6*  NEUTROABS 17.2*  --   HGB 14.5 12.3  HCT 43.4 38.0  MCV 87.1 88.2  PLT 214 195   Cardiac Enzymes: No results for input(s): CKTOTAL, CKMB, CKMBINDEX, TROPONINI in the last 168 hours. BNP: Invalid input(s): POCBNP CBG: No results for input(s): GLUCAP in the last 168 hours. D-Dimer No results for input(s): DDIMER in the last 72 hours. Hgb A1c No results for input(s): HGBA1C in the last 72 hours. Lipid Profile No results for input(s): CHOL, HDL, LDLCALC, TRIG, CHOLHDL, LDLDIRECT in the last 72 hours. Thyroid function studies No results for input(s): TSH, T4TOTAL, T3FREE, THYROIDAB in the last 72 hours.  Invalid input(s): FREET3 Anemia work up No results for input(s): VITAMINB12, FOLATE, FERRITIN, TIBC, IRON, RETICCTPCT in the last 72 hours. Microbiology Recent Results (from the past 240 hour(s))  Culture, blood (Routine X 2) w Reflex to ID Panel     Status: None (Preliminary result)   Collection Time: 05/16/16  9:37 AM  Result Value Ref Range Status    Specimen Description BLOOD LEFT ANTECUBITAL  Final   Special Requests IN PEDIATRIC BOTTLE  3 ML  Final   Culture NO GROWTH 1 DAY  Final   Report Status PENDING  Incomplete  Culture, blood (Routine X 2) w Reflex to ID Panel     Status: None (Preliminary result)   Collection Time: 05/16/16  9:40 AM  Result Value Ref Range Status   Specimen Description BLOOD RIGHT ANTECUBITAL  Final   Special Requests IN PEDIATRIC BOTTLE  3 ML  Final   Culture NO GROWTH 1 DAY  Final   Report Status PENDING  Incomplete     Discharge Instructions:   Discharge Instructions    Diet general    Complete by:  As directed    Discharge instructions    Complete by:  As directed    Return to ER for worsening of  infection/fever   Increase activity slowly    Complete by:  As directed        Medication List    TAKE these medications   acidophilus Caps capsule Take 2 capsules by mouth daily. Start taking on:  05/18/2016   clindamycin 300 MG capsule Commonly known as:  CLEOCIN Take 2 capsules (600 mg total) by mouth every 8 (eight) hours.   ibuprofen 200 MG tablet Commonly known as:  ADVIL,MOTRIN Take 200-400 mg by mouth every 6 (six) hours as needed for fever.      Follow-up Information    [facility] .   Why:  please call to arrange appt Contact information: [address] [place] [address] [phone]           Time coordinating discharge: 35 min  Signed:  Chanique Duca Juanetta Gosling   [facility] 05/17/2016, 5:14 PM

## 2016-05-18 ENCOUNTER — Encounter: Payer: Self-pay | Admitting: Obstetrics & Gynecology

## 2016-05-21 LAB — CULTURE, BLOOD (ROUTINE X 2)
Culture: NO GROWTH
Culture: NO GROWTH

## 2016-05-26 ENCOUNTER — Ambulatory Visit: Payer: Self-pay | Attending: Internal Medicine | Admitting: Physician Assistant

## 2016-05-26 VITALS — BP 100/67 | HR 69 | Temp 98.3°F | Resp 16 | Wt 141.0 lb

## 2016-05-26 DIAGNOSIS — R945 Abnormal results of liver function studies: Secondary | ICD-10-CM

## 2016-05-26 DIAGNOSIS — N6310 Unspecified lump in the right breast, unspecified quadrant: Secondary | ICD-10-CM | POA: Insufficient documentation

## 2016-05-26 DIAGNOSIS — R7989 Other specified abnormal findings of blood chemistry: Secondary | ICD-10-CM | POA: Insufficient documentation

## 2016-05-26 MED ORDER — FLUCONAZOLE 150 MG PO TABS: 150.0000 mg | ORAL_TABLET | Freq: Once | ORAL | 0 refills | Status: AC

## 2016-05-26 NOTE — Progress Notes (Signed)
Alison Zuniga, is a 25 y.o. female  NWG:[id number]  VHQ:[id number]  DOB - May 13, 1991  Subjective:  Chief Complaint and HPI: Alison Zuniga is a 25 y.o. female here today to establish care and for a follow up after being admitted to the hospital for a breast abscess and increased LFTs(05/15/2016-05/17/2016).  She was treated with IV Clndamycin followed by 10day home course.  She has much less breast tenderness and no fever.  There is still firmness and tenderness in the breast.  She completed the antibiotics.  She denies SBE or noticing a lump in her breast prior to her recent problem.  She denies abdominal pain or jaundice but her LFTs were mildly elevated in the hospital.  No pruritis. [employer] interpreters "Alison Zuniga" interpreting.  PMH unremarkable  ED/Hospital notes reviewed.     ROS:   Constitutional:  No f/c, No night sweats, No unexplained weight loss. EENT:  No vision changes, No blurry vision, No hearing changes. No mouth, throat, or ear problems.  Respiratory: No cough, No SOB Cardiac: No CP, no palpitations GI:  No abd pain, No N/V/D. GU: No Urinary s/sx Musculoskeletal: No joint pain Neuro: No headache, no dizziness, no motor weakness.  Skin: No rash Endocrine:  No polydipsia. No polyuria.  Psych: Denies SI/HI  No problems updated.  ALLERGIES: No Known Allergies  PAST MEDICAL HISTORY: Past Medical History:  Diagnosis Date  . No pertinent past medical history     MEDICATIONS AT HOME: Prior to Admission medications   Medication Sig Start Date End Date Taking? Authorizing Provider  acidophilus (RISAQUAD) CAPS capsule Take 2 capsules by mouth daily. Patient not taking: Reported on 05/26/2016 05/18/16   Joseph Art, DO  clindamycin (CLEOCIN) 300 MG capsule Take 2 capsules (600 mg total) by mouth every 8 (eight) hours. Patient not taking: Reported on 05/26/2016 05/17/16   Joseph Art, DO  fluconazole (DIFLUCAN) 150 MG tablet Take 1 tablet (150  mg total) by mouth once. 05/26/16 05/26/16  Anders Simmonds, PA-C  ibuprofen (ADVIL,MOTRIN) 200 MG tablet Take 200-400 mg by mouth every 6 (six) hours as needed for fever.    Historical Provider, MD  Norgestimate-Ethinyl Estradiol Triphasic 0.18/0.215/0.25 MG-25 MCG tab Take 1 tablet by mouth daily. Patient not taking: Reported on 05/26/2016 05/02/14   Adam Phenix, MD     Objective:  EXAM:   Vitals:   05/26/16 1514  BP: 100/67  Pulse: 69  Resp: 16  Temp: 98.3 F (36.8 C)  TempSrc: Oral  SpO2: 99%  Weight: 141 lb (64 kg)    General appearance : A&OX3. NAD. Non-toxic-appearing HEENT: Atraumatic and Normocephalic.  PERRLA. EOM intact.  TM clear B. Mouth-MMM, post pharynx WNL w/o erythema, No PND. Neck: supple, no JVD. No cervical lymphadenopathy. No thyromegaly Chest/Lungs:  Breathing-non-labored, Good air entry bilaterally, breath sounds normal without rales, rhonchi, or wheezing  CVS: S1 S2 regular, no murmurs, gallops, rubs  B breast examined-L breast unremarkable.  There is a tender and firm area at 9 o'clock on the R beast that is about 2X4cm area of fullness/firmness without a discreet mass that is tender.  The overlying skin is wnl WITHOUT P'EAU d'orange or dimpling. B axilla without swelling or lesion/fullness Abdomen: Bowel sounds present, Non tender and not distended with no gaurding, rigidity or rebound.  No hepatomegaly Extremities: Bilateral Lower Ext shows no edema, both legs are warm to touch with = pulse throughout Neurology:  CN II-XII grossly intact, Non focal.   Psych:  TP linear. J/I WNL. Normal speech. Appropriate eye contact and affect.  Skin:  No Rash  Data Review No results found for: HGBA1C   Assessment & Plan   1. Breast mass, right - MM Digital Diagnostic Unilat R; Future - US BREAST ASPIRATION RIGHT; Future Diflucan sent because of recent heavy antibiotics.  2. Elevated LFTs Check at next OV(sooner if any s/sx of problems).  Increase water  intake.  Avoid alcohol, tylenol.   Patient have been counseled extensively about nutrition and exercise  Return in about 4 weeks (around 06/23/2016) for establish care; f/up on MMG and recheck LFTs.  The patient was given clear instructions to go to ER or return to medical center if symptoms don't improve, worsen or new problems develop. The patient verbalized understanding. The patient was told to call to get lab results if they haven't heard anything in the next week.     Alison CoAngela Shanekqua Schaper, PA-C [facility] [place], [place] [phone]   05/26/2016, 3:42 PMPatient ID: Alison Zuniga, female   DOB: 10-11-1990, 25 y.o.   MRN: [id number]

## 2016-05-26 NOTE — Progress Notes (Signed)
Pt is in the office today a hospital follow up  Pt states she still has pain in her right breast Pt states she still feels a bump in her breast Pt states the hospital gave her medicine Pt states she also has a rash on her thighs and arms and she doesn't know where it is from

## 2016-08-12 ENCOUNTER — Encounter: Payer: Self-pay | Admitting: Family Medicine

## 2016-08-12 ENCOUNTER — Ambulatory Visit: Payer: 59 | Attending: Family Medicine | Admitting: Family Medicine

## 2016-08-12 VITALS — BP 98/64 | HR 74 | Temp 98.4°F | Resp 18 | Ht 60.0 in | Wt 138.4 lb

## 2016-08-12 DIAGNOSIS — N644 Mastodynia: Secondary | ICD-10-CM

## 2016-08-12 DIAGNOSIS — R945 Abnormal results of liver function studies: Secondary | ICD-10-CM

## 2016-08-12 DIAGNOSIS — R7989 Other specified abnormal findings of blood chemistry: Secondary | ICD-10-CM | POA: Insufficient documentation

## 2016-08-12 LAB — HEPATIC FUNCTION PANEL
ALK PHOS: 61 U/L (ref 33–115)
ALT: 18 U/L (ref 6–29)
AST: 18 U/L (ref 10–30)
Albumin: 4.5 g/dL (ref 3.6–5.1)
BILIRUBIN DIRECT: 0.1 mg/dL (ref <=0.2)
BILIRUBIN INDIRECT: 0.4 mg/dL (ref 0.2–1.2)
Total Bilirubin: 0.5 mg/dL (ref 0.2–1.2)
Total Protein: 7.2 g/dL (ref 6.1–8.1)

## 2016-08-12 MED ORDER — IBUPROFEN 600 MG PO TABS
600.0000 mg | ORAL_TABLET | Freq: Three times a day (TID) | ORAL | 0 refills | Status: AC | PRN
Start: 2016-08-12 — End: ?

## 2016-08-12 NOTE — Progress Notes (Signed)
Patient is here for estab. Care  BP 98/64 Pulse 74  Patient stated  has no pain today

## 2016-08-12 NOTE — Patient Instructions (Signed)
Mammogram Introduction A mammogram is an X-ray of the breasts that is done to check for changes that are not normal. This test can screen for and find any changes that may suggest breast cancer. This test can also help to find other changes and variations in the breast. What happens before the procedure?  Have this test done about 1-2 weeks after your period. This is usually when your breasts are the least tender.  If you are visiting a new doctor or clinic, send any past mammogram images to your new doctor's office.  Wash your breasts and under your arms the day of the test.  Do not use deodorants, perfumes, lotions, or powders on the day of the test.  Take off any jewelry from your neck.  Wear clothes that you can change into and out of easily. What happens during the procedure?  You will undress from the waist up. You will put on a gown.  You will stand in front of the X-ray machine.  Each breast will be placed between two plastic or glass plates. The plates will press down on your breast for a few seconds. Try to stay as relaxed as possible. This does not cause any harm to your breasts. Any discomfort you feel will be very brief.  X-rays will be taken from different angles of each breast. The procedure may vary among doctors and hospitals. What happens after the procedure?  The mammogram will be looked at by a specialist (radiologist).  You may need to do certain parts of the test again. This depends on the quality of the images.  Ask when your test results will be ready. Make sure you get your test results.  You may go back to your normal activities. This information is not intended to replace advice given to you by your health care provider. Make sure you discuss any questions you have with your health care provider. Document Released: 10/28/2008 Document Revised: 01/07/2016 Document Reviewed: 10/10/2014  2017 Elsevier  

## 2016-08-12 NOTE — Progress Notes (Signed)
Subjective:  Patient ID: Alison Zuniga, female    DOB: 06-19-1991  Age: 25 y.o. MRN: [id number]  CC: Establish Care  HPI Alison Zuniga presents for c/o breast tenderness and follow up  liver function test. Denies any lumps, indentations, or nipple discharge. She reports doing monthly self breast exams.Non-smoker.No family history of breast cancer. Reports After menstrual cycle left breast was swollen and painful for 2 weeks but lessened some. Pain 4/10. Denies abdominal pain or symptoms jaundice.   Outpatient Medications Prior to Visit  Medication Sig Dispense Refill  . acidophilus (RISAQUAD) CAPS capsule Take 2 capsules by mouth daily. (Patient not taking: Reported on 05/26/2016) 20 capsule 0  . clindamycin (CLEOCIN) 300 MG capsule Take 2 capsules (600 mg total) by mouth every 8 (eight) hours. (Patient not taking: Reported on 05/26/2016) 30 capsule 0  . ibuprofen (ADVIL,MOTRIN) 200 MG tablet Take 200-400 mg by mouth every 6 (six) hours as needed for fever.    . Norgestimate-Ethinyl Estradiol Triphasic 0.18/0.215/0.25 MG-25 MCG tab Take 1 tablet by mouth daily. (Patient not taking: Reported on 05/26/2016) 1 Package 11   No facility-administered medications prior to visit.     ROS Review of Systems  Respiratory: Negative.   Cardiovascular: Negative.   Gastrointestinal: Negative.   Genitourinary: Negative.   Skin:       Reports tenderness and pain to bilateral breasts.      Objective:  BP 98/64 (BP Location: Left Arm, Patient Position: Sitting, Cuff Size: Normal)   Pulse 74   Temp 98.4 F (36.9 C) (Oral)   Resp 18   Ht 5' (1.524 m)   Wt 138 lb 6.4 oz (62.8 kg)   SpO2 100%   BMI 27.03 kg/m   BP/Weight 08/12/2016 05/26/2016 05/17/2016  Systolic BP 98 100 100  Diastolic BP 64 67 69  Wt. (Lbs) 138.4 141 -  BMI 27.03 26.64 -   Physical Exam  Constitutional: She is oriented to person, place, and time.  Cardiovascular: Normal rate, regular rhythm and normal  heart sounds.   Pulmonary/Chest: Effort normal and breath sounds normal. Right breast exhibits tenderness. Right breast exhibits no nipple discharge and no skin change. Left breast exhibits tenderness. Left breast exhibits no nipple discharge and no skin change.  Abdominal: Soft. Bowel sounds are normal. She exhibits no distension and no mass. There is no tenderness.  Neurological: She is alert and oriented to person, place, and time.  Skin: Skin is warm and dry.   Assessment & Plan:   Problem List Items Addressed This Visit    None    Visit Diagnoses    Elevated liver function tests    -  Primary   Relevant Orders   Hepatic Function Panel (Completed)   Breast tenderness       Relevant Medications   ibuprofen (ADVIL,MOTRIN) 600 MG tablet   Other Relevant Orders   MM Digital Diagnostic Bilat   Breast pain       Relevant Orders   MM Digital Diagnostic Bilat     Meds ordered this encounter  Medications  . ibuprofen (ADVIL,MOTRIN) 600 MG tablet    Sig: Take 1 tablet (600 mg total) by mouth every 8 (eight) hours as needed for mild pain or moderate pain.    Dispense:  24 tablet    Refill:  0    Order Specific Question:   Supervising Provider    Answer:   Quentin AngstJEGEDE, OLUGBEMIGA E [id number]]    Follow-up: Return in 2 weeks (  on 08/26/2016) for Schedule pap smear.Lizbeth Bark.   Lennix Kneisel R Doristine Shehan FNP

## 2016-08-16 ENCOUNTER — Telehealth: Payer: Self-pay

## 2016-08-16 NOTE — Telephone Encounter (Signed)
Patient Verify DOB  Patient was aware and understood that her hepatic function results were normal.  Patient did not have further questions.

## 2016-08-17 ENCOUNTER — Telehealth: Payer: Self-pay | Admitting: Family Medicine

## 2016-08-17 NOTE — Telephone Encounter (Signed)
C[facility] with [facility] stated needs order on mamogram change to ultrasound for patient due to her age needs to be ultrasound. Also needs to know what side breast pain if both sides needs to orders. Please follow up [phone]

## 2016-08-18 ENCOUNTER — Other Ambulatory Visit: Payer: Self-pay | Admitting: Family Medicine

## 2016-08-18 DIAGNOSIS — N644 Mastodynia: Secondary | ICD-10-CM | POA: Insufficient documentation

## 2016-08-18 NOTE — Telephone Encounter (Signed)
Order was changed to US.

## 2016-08-18 NOTE — Telephone Encounter (Signed)
[facility]ast center is stating an US must be completed before a mammogram is approved at her age. Also specification of then pain site and if it is bilateral.

## 2016-08-19 NOTE — Telephone Encounter (Signed)
The patient has scheduled for the 19th pf this month.

## 2016-08-29 ENCOUNTER — Ambulatory Visit: Payer: Self-pay | Admitting: Family Medicine

## 2016-09-02 ENCOUNTER — Ambulatory Visit
Admission: RE | Admit: 2016-09-02 | Discharge: 2016-09-02 | Disposition: A | Discharge status: Home / Self Care | Payer: 59 | Source: Ambulatory Visit | Attending: Family Medicine | Admitting: Family Medicine

## 2016-09-02 ENCOUNTER — Other Ambulatory Visit: Payer: Self-pay | Admitting: Family Medicine

## 2016-09-02 DIAGNOSIS — N644 Mastodynia: Secondary | ICD-10-CM

## 2016-09-06 ENCOUNTER — Encounter: Payer: Self-pay | Admitting: Family Medicine

## 2016-09-06 ENCOUNTER — Other Ambulatory Visit (HOSPITAL_COMMUNITY)
Admission: RE | Admit: 2016-09-06 | Discharge: 2016-09-06 | Disposition: A | Discharge status: Home / Self Care | Payer: 59 | Source: Ambulatory Visit | Attending: Family Medicine | Admitting: Family Medicine

## 2016-09-06 ENCOUNTER — Ambulatory Visit: Payer: 59 | Attending: Family Medicine | Admitting: Family Medicine

## 2016-09-06 VITALS — BP 90/57 | HR 75 | Temp 98.3°F | Resp 18 | Ht 60.0 in | Wt 136.2 lb

## 2016-09-06 DIAGNOSIS — Z01419 Encounter for gynecological examination (general) (routine) without abnormal findings: Secondary | ICD-10-CM | POA: Insufficient documentation

## 2016-09-06 DIAGNOSIS — Z202 Contact with and (suspected) exposure to infections with a predominantly sexual mode of transmission: Secondary | ICD-10-CM | POA: Diagnosis not present

## 2016-09-06 DIAGNOSIS — Z113 Encounter for screening for infections with a predominantly sexual mode of transmission: Secondary | ICD-10-CM | POA: Insufficient documentation

## 2016-09-06 DIAGNOSIS — Z124 Encounter for screening for malignant neoplasm of cervix: Secondary | ICD-10-CM

## 2016-09-06 DIAGNOSIS — Z1151 Encounter for screening for human papillomavirus (HPV): Secondary | ICD-10-CM | POA: Diagnosis present

## 2016-09-06 DIAGNOSIS — Z3009 Encounter for other general counseling and advice on contraception: Secondary | ICD-10-CM | POA: Diagnosis not present

## 2016-09-06 NOTE — Progress Notes (Signed)
   Subjective:  Patient ID: Alison Zuniga, female    DOB: 1991/05/06  Age: 26 y.o. MRN: [id number]  CC: Establish Care   HPI Alison Zuniga presents  for routine pap exam. She is also requesting STD testing. Denies any vaginal discharge, lesions, abdominal pain, dysuria, or hematuria. Reports 1 sexual partner within the last 3 months, unprotected. Reports interest in Nexplanon implant for birth control. Denies history of migraine headaches, blood clots or clotting disorders, hypertension, or swelling of the legs.   Outpatient Medications Prior to Visit  Medication Sig Dispense Refill  . acidophilus (RISAQUAD) CAPS capsule Take 2 capsules by mouth daily. (Patient not taking: Reported on 05/26/2016) 20 capsule 0  . clindamycin (CLEOCIN) 300 MG capsule Take 2 capsules (600 mg total) by mouth every 8 (eight) hours. (Patient not taking: Reported on 05/26/2016) 30 capsule 0  . ibuprofen (ADVIL,MOTRIN) 200 MG tablet Take 200-400 mg by mouth every 6 (six) hours as needed for fever.    Marland Kitchen. ibuprofen (ADVIL,MOTRIN) 600 MG tablet Take 1 tablet (600 mg total) by mouth every 8 (eight) hours as needed for mild pain or moderate pain. (Patient not taking: Reported on 09/06/2016) 24 tablet 0  . Norgestimate-Ethinyl Estradiol Triphasic 0.18/0.215/0.25 MG-25 MCG tab Take 1 tablet by mouth daily. (Patient not taking: Reported on 05/26/2016) 1 Package 11   No facility-administered medications prior to visit.     ROS Review of Systems  Respiratory: Negative.   Cardiovascular: Negative.   Gastrointestinal: Negative.   Genitourinary: Negative.     Objective:  BP (!) 90/57 (BP Location: Left Arm, Patient Position: Sitting, Cuff Size: Normal)   Pulse 75   Temp 98.3 F (36.8 C) (Oral)   Resp 18   Ht 5' (1.524 m)   Wt 136 lb 3.2 oz (61.8 kg)   LMP 08/29/2016   SpO2 99%   BMI 26.60 kg/m   BP/Weight 09/06/2016 08/12/2016 05/26/2016  Systolic BP 90 98 100  Diastolic BP 57 64 67  Wt. (Lbs)  136.2 138.4 141  BMI 26.6 27.03 26.64    Physical Exam  Cardiovascular: Normal rate, regular rhythm, normal heart sounds and intact distal pulses.   Pulmonary/Chest: Effort normal and breath sounds normal.  Abdominal: Soft. Bowel sounds are normal.  Genitourinary: Vagina normal.  Nursing note and vitals reviewed.   Assessment & Plan:   Problem List Items Addressed This Visit    None    Visit Diagnoses    Screening for STDs (sexually transmitted diseases)    -  Primary   Relevant Orders   Cervicovaginal ancillary only   STD Panel (HBSAG,HIV,RPR)   Pap smear for cervical cancer screening       Relevant Orders   Cytology - PAP [place] (Completed)   Birth control counseling       -Patient will be referred to Gynecologist for Nexplanon .   Relevant Orders   Ambulatory referral to Gynecology      No orders of the defined types were placed in this encounter.     Lizbeth BarkMandesia R Moncerrath Berhe FNP

## 2016-09-06 NOTE — Progress Notes (Signed)
Patient is here for PAP  Patient has eaten today  Patient has not taken any meds today  Patient complains about hot flashes

## 2016-09-06 NOTE — Patient Instructions (Signed)
Prueba de Papanicolaou (Pap Test) POR QU ME DEBO REALIZAR ESTA PRUEBA? A esta prueba tambin se la denomina "frotis de Pap". Es una prueba de deteccin que se utiliza para detectar signos de cncer de vagina, cuello del tero y tero. La prueba tambin puede identificar la presencia de infeccin o cambios precancerosos. El mdico probablemente le recomiende que se realice esta prueba en forma regular. Esta prueba puede realizarse de la siguiente manera:  Cada 3 aos, a partir de los 21 aos.  Cada 5 aos, en combinacin con las pruebas que se realizan para detectar la presencia del virus del papiloma humano (VPH).  Con mayor o menor frecuencia, en funcin de otras enfermedades que tenga. QU TIPO DE MUESTRA SE TOMA? El mdico utilizar un pequeo hisopo de algodn, una esptula de plstico o un cepillo para recolectar una muestra de clulas de la superficie del cuello del tero. El cuello del tero es la apertura del tero, que tambin se conoce como matriz. Tambin pueden recolectarse las secreciones del cuello del tero y la vagina. CMO DEBO PREPARARME PARA ESTA PRUEBA?  Tenga en cuenta en qu etapa del ciclo menstrual se encuentra. Es posible que deba reprogramar la prueba si est menstruando el da en que debe realizrsela.  Si el da en que debe realizarse la prueba tiene una infeccin vaginal aparente, deber reprogramar la prueba.  Pueden pedirle que evite tomar una ducha o bao el da de la prueba o el da anterior.  Algunos medicamentos pueden provocar resultados anormales de la prueba, como los digitlicos y la tetraciclina. Si toma alguno de estos medicamentos, hable con su mdico antes de realizarse la prueba. QU SIGNIFICAN LOS RESULTADOS? Los resultados anormales de la prueba pueden indicar diversas enfermedades. Estas pueden incluir lo siguiente:  Cncer. Si bien los resultados de la prueba de Papanicolaou no pueden utilizarse para diagnosticar cncer de cuello del tero,  de vagina o de tero, pueden indicar que existe una posibilidad de presencia de cncer. En este caso, ser necesario realizar pruebas adicionales para determinar la presencia de cncer.  Enfermedad de transmisin sexual.  Infecciones por hongos.  Infeccin por parsitos.  Infeccin por herpes.  Una enfermedad que causa o favorece la infertilidad. Es su responsabilidad retirar el resultado del estudio. Consulte en el laboratorio o en el departamento en el que fue realizado el estudio cundo y cmo podr obtener los resultados. Comunquese con el mdico si tiene preguntas sobre los resultados. Esta informacin no tiene como fin reemplazar el consejo del mdico. Asegrese de hacerle al mdico cualquier pregunta que tenga. Document Released: 01/18/2008 Document Revised: 08/22/2014 Document Reviewed: 12/23/2013 Elsevier Interactive Patient Education  2017 [employer]  

## 2016-09-07 ENCOUNTER — Ambulatory Visit: Payer: Self-pay | Admitting: Family Medicine

## 2016-09-08 LAB — CYTOLOGY - PAP
DIAGNOSIS: NEGATIVE
HPV: NOT DETECTED

## 2016-09-08 LAB — CERVICOVAGINAL ANCILLARY ONLY
Bacterial vaginitis: NEGATIVE
CANDIDA VAGINITIS: NEGATIVE
CHLAMYDIA, DNA PROBE: NEGATIVE
Neisseria Gonorrhea: NEGATIVE
Trichomonas: NEGATIVE

## 2016-09-13 LAB — CERVICOVAGINAL ANCILLARY ONLY: Herpes: NEGATIVE

## 2016-09-15 NOTE — Telephone Encounter (Signed)
-----   Message from Lizbeth BarkMandesia R Hairston, FNP sent at 09/14/2016  8:37 AM EST ----- -Pap is normal. -Herpes, Gonorrhea, Chlamydia, BV, Yeast, and Trichomonas were all negative.

## 2016-09-15 NOTE — Telephone Encounter (Signed)
Patient Verify DOB  Patient call back the CMA   CMA explain that her PAP was normal & her other sexual transmitted disease was negative  Patient was aware and understood her results

## 2016-09-15 NOTE — Telephone Encounter (Signed)
CMA call to go over her PAP results Patient did not answer, and VM to full to leave a message

## 2016-10-31 ENCOUNTER — Ambulatory Visit: Payer: 59 | Admitting: Obstetrics & Gynecology
